# Patient Record
Sex: Female | Born: 1990 | Race: White | Hispanic: No | Marital: Single | Smoking: Never smoker
Health system: Southern US, Community
[De-identification: ages and names within clinical notes are randomized; demographics above are authoritative.]

## PROBLEM LIST (undated history)

## (undated) ENCOUNTER — Inpatient Hospital Stay (HOSPITAL_COMMUNITY): Payer: 59

## (undated) DIAGNOSIS — R519 Headache, unspecified: Secondary | ICD-10-CM

## (undated) DIAGNOSIS — S34109A Unspecified injury to unspecified level of lumbar spinal cord, initial encounter: Secondary | ICD-10-CM

## (undated) DIAGNOSIS — F32A Depression, unspecified: Secondary | ICD-10-CM

## (undated) DIAGNOSIS — N200 Calculus of kidney: Secondary | ICD-10-CM

## (undated) DIAGNOSIS — F419 Anxiety disorder, unspecified: Secondary | ICD-10-CM

## (undated) DIAGNOSIS — F329 Major depressive disorder, single episode, unspecified: Secondary | ICD-10-CM

## (undated) HISTORY — PX: WISDOM TOOTH EXTRACTION: SHX21

---
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
## 2018-05-15 DIAGNOSIS — Z113 Encounter for screening for infections with a predominantly sexual mode of transmission: Secondary | ICD-10-CM | POA: Diagnosis not present

## 2018-08-15 DIAGNOSIS — Z113 Encounter for screening for infections with a predominantly sexual mode of transmission: Secondary | ICD-10-CM | POA: Diagnosis not present

## 2018-08-15 DIAGNOSIS — Z114 Encounter for screening for human immunodeficiency virus [HIV]: Secondary | ICD-10-CM | POA: Diagnosis not present

## 2018-08-22 DIAGNOSIS — F526 Dyspareunia not due to a substance or known physiological condition: Secondary | ICD-10-CM | POA: Diagnosis not present

## 2018-08-22 DIAGNOSIS — R102 Pelvic and perineal pain: Secondary | ICD-10-CM | POA: Diagnosis not present

## 2018-08-22 DIAGNOSIS — Z30431 Encounter for routine checking of intrauterine contraceptive device: Secondary | ICD-10-CM | POA: Diagnosis not present

## 2018-09-04 DIAGNOSIS — N941 Unspecified dyspareunia: Secondary | ICD-10-CM | POA: Diagnosis not present

## 2018-09-04 DIAGNOSIS — R102 Pelvic and perineal pain: Secondary | ICD-10-CM | POA: Diagnosis not present

## 2018-10-18 DIAGNOSIS — Z113 Encounter for screening for infections with a predominantly sexual mode of transmission: Secondary | ICD-10-CM | POA: Diagnosis not present

## 2018-10-18 DIAGNOSIS — Z114 Encounter for screening for human immunodeficiency virus [HIV]: Secondary | ICD-10-CM | POA: Diagnosis not present

## 2018-10-18 DIAGNOSIS — K1370 Unspecified lesions of oral mucosa: Secondary | ICD-10-CM | POA: Diagnosis not present

## 2019-02-20 ENCOUNTER — Other Ambulatory Visit (HOSPITAL_COMMUNITY)
Admission: RE | Admit: 2019-02-20 | Discharge: 2019-02-20 | Disposition: A | Discharge status: Home / Self Care | Payer: BC Managed Care – PPO | Source: Ambulatory Visit | Attending: Obstetrics and Gynecology | Admitting: Obstetrics and Gynecology

## 2019-02-20 ENCOUNTER — Other Ambulatory Visit: Payer: Self-pay | Admitting: Obstetrics and Gynecology

## 2019-02-20 DIAGNOSIS — N898 Other specified noninflammatory disorders of vagina: Secondary | ICD-10-CM | POA: Diagnosis not present

## 2019-02-20 DIAGNOSIS — N9089 Other specified noninflammatory disorders of vulva and perineum: Secondary | ICD-10-CM | POA: Diagnosis not present

## 2019-02-20 DIAGNOSIS — Z01419 Encounter for gynecological examination (general) (routine) without abnormal findings: Secondary | ICD-10-CM | POA: Diagnosis not present

## 2019-02-22 LAB — CYTOLOGY - PAP
Chlamydia: NEGATIVE
Neisseria Gonorrhea: NEGATIVE

## 2019-03-12 ENCOUNTER — Other Ambulatory Visit: Payer: Self-pay

## 2019-03-12 ENCOUNTER — Emergency Department (HOSPITAL_COMMUNITY): Payer: BC Managed Care – PPO

## 2019-03-12 ENCOUNTER — Encounter (HOSPITAL_COMMUNITY): Payer: Self-pay

## 2019-03-12 ENCOUNTER — Emergency Department (HOSPITAL_COMMUNITY)
Admission: EM | Admit: 2019-03-12 | Discharge: 2019-03-12 | Disposition: A | Discharge status: Home / Self Care | Payer: BC Managed Care – PPO | Attending: Emergency Medicine | Admitting: Emergency Medicine

## 2019-03-12 DIAGNOSIS — N83201 Unspecified ovarian cyst, right side: Secondary | ICD-10-CM | POA: Insufficient documentation

## 2019-03-12 DIAGNOSIS — R102 Pelvic and perineal pain: Secondary | ICD-10-CM | POA: Diagnosis not present

## 2019-03-12 DIAGNOSIS — N83291 Other ovarian cyst, right side: Secondary | ICD-10-CM | POA: Diagnosis not present

## 2019-03-12 DIAGNOSIS — R109 Unspecified abdominal pain: Secondary | ICD-10-CM

## 2019-03-12 HISTORY — DX: Calculus of kidney: N20.0

## 2019-03-12 HISTORY — DX: Anxiety disorder, unspecified: F41.9

## 2019-03-12 HISTORY — DX: Unspecified injury to unspecified level of lumbar spinal cord, initial encounter: S34.109A

## 2019-03-12 HISTORY — DX: Depression, unspecified: F32.A

## 2019-03-12 LAB — COMPREHENSIVE METABOLIC PANEL
ALT: 20 U/L (ref 0–44)
AST: 21 U/L (ref 15–41)
Albumin: 4.1 g/dL (ref 3.5–5.0)
Alkaline Phosphatase: 57 U/L (ref 38–126)
Anion gap: 12 (ref 5–15)
BUN: 10 mg/dL (ref 6–20)
CO2: 24 mmol/L (ref 22–32)
Calcium: 9.5 mg/dL (ref 8.9–10.3)
Chloride: 106 mmol/L (ref 98–111)
Creatinine, Ser: 0.88 mg/dL (ref 0.44–1.00)
GFR calc Af Amer: >60 mL/min (ref >60)
GFR calc non Af Amer: >60 mL/min (ref >60)
Glucose, Bld: 106 mg/dL — ABNORMAL HIGH (ref 70–99)
Potassium: 4.6 mmol/L (ref 3.5–5.1)
Sodium: 142 mmol/L (ref 135–145)
Total Bilirubin: 0.6 mg/dL (ref 0.3–1.2)
Total Protein: 7 g/dL (ref 6.5–8.1)

## 2019-03-12 LAB — URINALYSIS, ROUTINE W REFLEX MICROSCOPIC
Bilirubin Urine: NEGATIVE
Glucose, UA: NEGATIVE mg/dL
Hgb urine dipstick: NEGATIVE
Ketones, ur: NEGATIVE mg/dL
Leukocytes,Ua: NEGATIVE
Nitrite: NEGATIVE
Protein, ur: NEGATIVE mg/dL
Specific Gravity, Urine: 1.018 (ref 1.005–1.030)
pH: 7 (ref 5.0–8.0)

## 2019-03-12 LAB — CBC
HCT: 43.7 % (ref 36.0–46.0)
Hemoglobin: 14.6 g/dL (ref 12.0–15.0)
MCH: 30.4 pg (ref 26.0–34.0)
MCHC: 33.4 g/dL (ref 30.0–36.0)
MCV: 91 fL (ref 80.0–100.0)
Platelets: 355 10*3/uL (ref 150–400)
RBC: 4.8 MIL/uL (ref 3.87–5.11)
RDW: 12 % (ref 11.5–15.5)
WBC: 11.6 10*3/uL — ABNORMAL HIGH (ref 4.0–10.5)
nRBC: 0 % (ref 0.0–0.2)

## 2019-03-12 LAB — I-STAT BETA HCG BLOOD, ED (MC, WL, AP ONLY): I-stat hCG, quantitative: <5 m[IU]/mL (ref <5)

## 2019-03-12 LAB — LIPASE, BLOOD: Lipase: 23 U/L (ref 11–51)

## 2019-03-12 MED ORDER — ACETAMINOPHEN 325 MG PO TABS
650.0000 mg | ORAL_TABLET | Freq: Once | ORAL | Status: AC
  Administered 2019-03-12: 09:00:00 650 mg via ORAL
  Filled 2019-03-12: qty 2

## 2019-03-12 MED ORDER — ONDANSETRON HCL 4 MG/2ML IJ SOLN
4.0000 mg | Freq: Once | INTRAMUSCULAR | Status: AC
  Administered 2019-03-12: 4 mg via INTRAVENOUS
  Filled 2019-03-12: qty 2

## 2019-03-12 MED ORDER — SODIUM CHLORIDE 0.9 % IV BOLUS
1000.0000 mL | Freq: Once | INTRAVENOUS | Status: AC
  Administered 2019-03-12: 1000 mL via INTRAVENOUS

## 2019-03-12 NOTE — Discharge Instructions (Signed)
You have a 3.5 cm right-sided ovarian cyst that may be causing your pain, we did not see any evidence of right-sided kidney stones and there was no evidence of an ovarian torsion on your ultrasound.  Continue using Tylenol, and warm compresses.  Follow-up with your OB/GYN next week as planned, return for new or worsening pain or any other new or concerning symptoms.

## 2019-03-12 NOTE — ED Notes (Signed)
Pt transported to CT ?

## 2019-03-12 NOTE — ED Triage Notes (Signed)
Pt endorses right sided flank pain x 5 hours, hx of kidney stone sand this feels the same. 1 episode of n/v. Tachy.

## 2019-03-12 NOTE — ED Provider Notes (Signed)
[facility]PART[facility] Provider Note   CSN: [id number] Arrival date & time: 03/12/19  5053     History   Chief Complaint Chief Complaint  Patient presents with   Nephrolithiasis    HPI UNIQUA KIHN is a 28 y.o. female.     TASHEIKA KITZMILLER is a 28 y.o. female with a history of kidney stones, lumbar spinal cord injury, anxiety and depression, who presents to the emergency department for evaluation of right-sided flank pain.  She reports flank pain started suddenly in the middle of the night about 5 hours prior to arrival.  She reports that she does have a history of kidney stones, although has not had any in a few years and reports pain feels similar.  She reports pain starts in her flank and radiates into her right lower abdomen near her ovary.  She reports that yesterday throughout the day she felt nauseated and had a few episodes of emesis, initially thought this was related to drinking the night prior, but symptoms persisted and then pain developed.  She does report a history of ovarian cysts, has an IUD in place, reports that her most recent ultrasound, she did not have a cyst present on the right side.  She has not had any vaginal bleeding or discharge.  She has not had any burning or pain with urination, and denies any blood in her urine.  Has never required surgery or lithotripsy for kidney stones in the past.  Has not taken anything for pain prior to arrival.  No fevers or chills.  No other aggravating or alleviating factors.     Past Medical History:  Diagnosis Date   Anxiety    Depression    Kidney stone    Lumbar spinal cord injury ([place])     There are no active problems to display for this patient.   History reviewed. No pertinent surgical history.   OB History   No obstetric history on file.      Home Medications    Prior to Admission medications   Not on File    Family History History reviewed. No pertinent family  history.  Social History Social History   Tobacco Use   Smoking status: Never Smoker  Substance Use Topics   Alcohol use: Yes    Comment: 2 times per week   Drug use: Never     Allergies   Augmentin [amoxicillin-pot clavulanate], Codeine, Ibuprofen, Lexapro [escitalopram oxalate], Lortab [hydrocodone-acetaminophen], and Zoloft [sertraline hcl]   Review of Systems Review of Systems  Constitutional: Negative for chills and fever.  Respiratory: Negative for cough and shortness of breath.   Cardiovascular: Negative for chest pain.  Gastrointestinal: Positive for abdominal pain, nausea and vomiting. Negative for blood in stool, constipation and diarrhea.  Genitourinary: Positive for flank pain and pelvic pain. Negative for dysuria, frequency, hematuria, vaginal bleeding and vaginal discharge.  Musculoskeletal: Negative for arthralgias and myalgias.  Skin: Negative for color change and rash.  Neurological: Negative for dizziness, syncope and light-headedness.     Physical Exam Updated Vital Signs BP (!) 140/107 (BP Location: Right Arm)    Pulse (!) 118    Temp 97.9 F (36.6 C) (Oral)    Resp 18    SpO2 99%   Physical Exam Vitals signs and nursing note reviewed.  Constitutional:      General: She is not in acute distress.    Appearance: Normal appearance. She is well-developed. She is obese. She is not  ill-appearing or diaphoretic.     Comments: Well-appearing and in no acute distress.  HENT:     Head: Normocephalic and atraumatic.     Mouth/Throat:     Mouth: Mucous membranes are moist.     Pharynx: Oropharynx is clear.  Eyes:     General:        Right eye: No discharge.        Left eye: No discharge.     Pupils: Pupils are equal, round, and reactive to light.  Neck:     Musculoskeletal: Neck supple.  Cardiovascular:     Rate and Rhythm: Normal rate and regular rhythm.     Heart sounds: Normal heart sounds. No murmur. No friction rub. No gallop.   Pulmonary:      Effort: Pulmonary effort is normal. No respiratory distress.     Breath sounds: Normal breath sounds. No wheezing or rales.     Comments: Respirations equal and unlabored, patient able to speak in full sentences, lungs clear to auscultation bilaterally Abdominal:     General: Bowel sounds are normal. There is no distension.     Palpations: Abdomen is soft. There is no mass.     Tenderness: There is abdominal tenderness. There is no guarding.     Comments: Abdomen soft, nondistended, mild tenderness in the right lower quadrant without guarding or peritoneal signs, no CVA tenderness bilaterally.  Musculoskeletal:        General: No deformity.     Right lower leg: No edema.     Left lower leg: No edema.  Skin:    General: Skin is warm and dry.     Capillary Refill: Capillary refill takes less than 2 seconds.  Neurological:     Mental Status: She is alert.     Coordination: Coordination normal.     Comments: Speech is clear, able to follow commands Moves extremities without ataxia, coordination intact  Psychiatric:        Mood and Affect: Mood normal.        Behavior: Behavior normal.      ED Treatments / Results  Labs (all labs ordered are listed, but only abnormal results are displayed) Labs Reviewed  COMPREHENSIVE METABOLIC PANEL - Abnormal; Notable for the following components:      Result Value   Glucose, Bld 106 (*)    All other components within normal limits  CBC - Abnormal; Notable for the following components:   WBC 11.6 (*)    All other components within normal limits  LIPASE, BLOOD  URINALYSIS, ROUTINE W REFLEX MICROSCOPIC  I-STAT BETA HCG BLOOD, ED ([facility], [facility], [facility] ONLY)    EKG None  Radiology [place] Transvaginal Non-ob  Result Date: 03/12/2019 CLINICAL DATA:  Acute onset right pelvic and groin pain for 1 day. Clinical concern for ovarian torsion. EXAM: TRANSABDOMINAL AND TRANSVAGINAL ULTRASOUND OF PELVIS DOPPLER ULTRASOUND OF OVARIES TECHNIQUE: Both transabdominal and  transvaginal ultrasound examinations of the pelvis were performed. Transabdominal technique was performed for global imaging of the pelvis including uterus, ovaries, adnexal regions, and pelvic cul-de-sac. It was necessary to proceed with endovaginal exam following the transabdominal exam to visualize the endometrium and ovaries. Color and duplex Doppler ultrasound was utilized to evaluate blood flow to the ovaries. COMPARISON:  None. FINDINGS: Uterus Measurements: 8.4 x 2.3 x 4.1 cm = volume: 51 mL. Retroflexed. No fibroids or other mass visualized. Endometrium Thickness: 3 mm.  IUD visualized within the endometrial cavity. Right ovary Measurements: 5.3 x 3.4 x  4.2 cm = volume: 39 mL. A complex cyst containing blood clot with retracted margins is seen which measures 3.5 x 2.6 x 2.4 cm. This has classic features for a hemorrhagic ovarian cyst. Left ovary Measurements: 3.0 x 2.8 x 1.7 cm = volume: 6 mL. Normal appearance/no adnexal mass. Pulsed Doppler evaluation of both ovaries demonstrates normal low-resistance arterial and venous waveforms. Other findings No abnormal free fluid. IMPRESSION: 3.5 cm benign-appearing hemorrhagic cyst of right ovary. IUD in appropriate position. Electronically Signed   By: Danae Orleans M.D.   On: 03/12/2019 11:19   US Pelvis Complete  Result Date: 03/12/2019 CLINICAL DATA:  Acute onset right pelvic and groin pain for 1 day. Clinical concern for ovarian torsion. EXAM: TRANSABDOMINAL AND TRANSVAGINAL ULTRASOUND OF PELVIS DOPPLER ULTRASOUND OF OVARIES TECHNIQUE: Both transabdominal and transvaginal ultrasound examinations of the pelvis were performed. Transabdominal technique was performed for global imaging of the pelvis including uterus, ovaries, adnexal regions, and pelvic cul-de-sac. It was necessary to proceed with endovaginal exam following the transabdominal exam to visualize the endometrium and ovaries. Color and duplex Doppler ultrasound was utilized to evaluate blood flow to  the ovaries. COMPARISON:  None. FINDINGS: Uterus Measurements: 8.4 x 2.3 x 4.1 cm = volume: 51 mL. Retroflexed. No fibroids or other mass visualized. Endometrium Thickness: 3 mm.  IUD visualized within the endometrial cavity. Right ovary Measurements: 5.3 x 3.4 x 4.2 cm = volume: 39 mL. A complex cyst containing blood clot with retracted margins is seen which measures 3.5 x 2.6 x 2.4 cm. This has classic features for a hemorrhagic ovarian cyst. Left ovary Measurements: 3.0 x 2.8 x 1.7 cm = volume: 6 mL. Normal appearance/no adnexal mass. Pulsed Doppler evaluation of both ovaries demonstrates normal low-resistance arterial and venous waveforms. Other findings No abnormal free fluid. IMPRESSION: 3.5 cm benign-appearing hemorrhagic cyst of right ovary. IUD in appropriate position. Electronically Signed   By: Danae Orleans M.D.   On: 03/12/2019 11:19   [place] Art/ven Flow Abd Pelv Doppler  Result Date: 03/12/2019 CLINICAL DATA:  Acute onset right pelvic and groin pain for 1 day. Clinical concern for ovarian torsion. EXAM: TRANSABDOMINAL AND TRANSVAGINAL ULTRASOUND OF PELVIS DOPPLER ULTRASOUND OF OVARIES TECHNIQUE: Both transabdominal and transvaginal ultrasound examinations of the pelvis were performed. Transabdominal technique was performed for global imaging of the pelvis including uterus, ovaries, adnexal regions, and pelvic cul-de-sac. It was necessary to proceed with endovaginal exam following the transabdominal exam to visualize the endometrium and ovaries. Color and duplex Doppler ultrasound was utilized to evaluate blood flow to the ovaries. COMPARISON:  None. FINDINGS: Uterus Measurements: 8.4 x 2.3 x 4.1 cm = volume: 51 mL. Retroflexed. No fibroids or other mass visualized. Endometrium Thickness: 3 mm.  IUD visualized within the endometrial cavity. Right ovary Measurements: 5.3 x 3.4 x 4.2 cm = volume: 39 mL. A complex cyst containing blood clot with retracted margins is seen which measures 3.5 x 2.6 x 2.4 cm.  This has classic features for a hemorrhagic ovarian cyst. Left ovary Measurements: 3.0 x 2.8 x 1.7 cm = volume: 6 mL. Normal appearance/no adnexal mass. Pulsed Doppler evaluation of both ovaries demonstrates normal low-resistance arterial and venous waveforms. Other findings No abnormal free fluid. IMPRESSION: 3.5 cm benign-appearing hemorrhagic cyst of right ovary. IUD in appropriate position. Electronically Signed   By: Danae Orleans M.D.   On: 03/12/2019 11:19   Ct Renal Stone Study  Result Date: 03/12/2019 CLINICAL DATA:  Right flank pain in a patient with a history  of urinary tract stones. EXAM: CT ABDOMEN AND PELVIS WITHOUT CONTRAST TECHNIQUE: Multidetector CT imaging of the abdomen and pelvis was performed following the standard protocol without IV contrast. COMPARISON:  CT abdomen and pelvis 03/27/2014. FINDINGS: Lower chest: Lung bases clear.  No pleural or pericardial effusion. Hepatobiliary: No focal liver abnormality is seen. No gallstones, gallbladder wall thickening, or biliary dilatation. Pancreas: Unremarkable. No pancreatic ductal dilatation or surrounding inflammatory changes. Spleen: Normal in size without focal abnormality. Adrenals/Urinary Tract: The adrenal glands appear normal. 0.6 cm nonobstructing stone lower pole left kidney is identified. No other urinary tract stones. No hydronephrosis. The right kidney appears normal. Urinary bladder and ureters are normal in appearance. Stomach/Bowel: Stomach is within normal limits. Appendix appears normal. No evidence of bowel wall thickening, distention, or inflammatory changes. Vascular/Lymphatic: No significant vascular findings are present. No enlarged abdominal or pelvic lymph nodes. Reproductive: Uterus and bilateral adnexa are unremarkable. 3.8 cm in diameter right ovarian cyst incidentally noted. IUD is in place. Other: Small fat containing umbilical hernia. Musculoskeletal: Negative. IMPRESSION: No acute abnormality abdomen or pelvis. 0.6  cm nonobstructing stone lower pole left kidney. Electronically Signed   By: Drusilla Kannerhomas  Dalessio M.D.   On: 03/12/2019 09:35    Procedures Procedures (including critical care time)  Medications Ordered in ED Medications  sodium chloride 0.9 % bolus 1,000 mL (1,000 mLs Intravenous New Bag/Given 03/12/19 0853)  ondansetron (ZOFRAN) injection 4 mg (4 mg Intravenous Given 03/12/19 0850)  acetaminophen (TYLENOL) tablet 650 mg (650 mg Oral Given 03/12/19 0851)     Initial Impression / Assessment and Plan / ED Course  I have reviewed the triage vital signs and the nursing notes.  Pertinent labs & imaging results that were available during my care of the patient were reviewed by me and considered in my medical decision making (see chart for details).  Patient presents with 5 hours of right flank pain radiating to the right lower abdomen, associated nausea and vomiting started yesterday.  No fevers.  Patient with history of both ovarian cysts and kidney stones, feels most similar to her kidney stone.  Will check abdominal labs and CT renal stone study.  Presentation seems most concerning for kidney stone, but would also consider appendicitis or ovarian cyst/torsion.  Offered narcotic pain medication but patient declined, given IV fluids, Zofran and Tylenol.  Labs reassuring, minimal leukocytosis of 11.6, normal hemoglobin, no acute electrolyte derangements, normal renal function and liver function and normal lipase.  Urinalysis without signs of infection, no hematuria or crystals noted.  Negative pregnancy.  CT renal stone study shows a 3.8 cm right ovarian cyst, IUD in place, there is a 0.6 cm nonobstructing stone within the lower left kidney but no right-sided renal or ureteral stones to explain patient's symptoms.  I suspect pain may be coming from ovarian cyst, will get ultrasound to rule out torsion.  Pelvic ultrasound shows a 3.5 cm benign-appearing cyst, likely hemorrhagic, no evidence of ovarian  torsion, no other acute findings within the pelvis.  Patient has upcoming follow-up appointment with her OB/GYN, will have her discussed cyst, continue with Tylenol and warm compresses as needed for pain.  Return precautions discussed.  Patient expresses understanding and agreement with plan.  Discharged home in good condition.  Final Clinical Impressions(s) / ED Diagnoses   Final diagnoses:  Right ovarian cyst  Right flank pain    ED Discharge Orders    None       Dartha LodgeFord, Matilda Fleig N, [place] JerseyPA-C 03/12/19 1201  Milagros Loll, MD 03/13/19 [phone]

## 2019-03-15 ENCOUNTER — Other Ambulatory Visit: Payer: Self-pay | Admitting: Obstetrics and Gynecology

## 2019-03-15 DIAGNOSIS — Z3202 Encounter for pregnancy test, result negative: Secondary | ICD-10-CM | POA: Diagnosis not present

## 2019-03-15 DIAGNOSIS — N83201 Unspecified ovarian cyst, right side: Secondary | ICD-10-CM | POA: Diagnosis not present

## 2019-03-15 DIAGNOSIS — N870 Mild cervical dysplasia: Secondary | ICD-10-CM | POA: Diagnosis not present

## 2019-04-16 DIAGNOSIS — F411 Generalized anxiety disorder: Secondary | ICD-10-CM | POA: Diagnosis not present

## 2019-04-16 DIAGNOSIS — R5383 Other fatigue: Secondary | ICD-10-CM | POA: Diagnosis not present

## 2019-04-16 DIAGNOSIS — E669 Obesity, unspecified: Secondary | ICD-10-CM | POA: Diagnosis not present

## 2019-04-16 DIAGNOSIS — F324 Major depressive disorder, single episode, in partial remission: Secondary | ICD-10-CM | POA: Diagnosis not present

## 2019-04-30 DIAGNOSIS — N83201 Unspecified ovarian cyst, right side: Secondary | ICD-10-CM | POA: Diagnosis not present

## 2019-05-21 DIAGNOSIS — F324 Major depressive disorder, single episode, in partial remission: Secondary | ICD-10-CM | POA: Diagnosis not present

## 2019-05-21 DIAGNOSIS — F411 Generalized anxiety disorder: Secondary | ICD-10-CM | POA: Diagnosis not present

## 2019-06-09 ENCOUNTER — Emergency Department (HOSPITAL_COMMUNITY)
Admission: EM | Admit: 2019-06-09 | Discharge: 2019-06-09 | Disposition: A | Discharge status: Home / Self Care | Payer: BC Managed Care – PPO | Attending: Emergency Medicine | Admitting: Emergency Medicine

## 2019-06-09 ENCOUNTER — Other Ambulatory Visit: Payer: Self-pay

## 2019-06-09 ENCOUNTER — Encounter (HOSPITAL_COMMUNITY): Payer: Self-pay

## 2019-06-09 DIAGNOSIS — S51851D Open bite of right forearm, subsequent encounter: Secondary | ICD-10-CM | POA: Diagnosis not present

## 2019-06-09 DIAGNOSIS — Z885 Allergy status to narcotic agent status: Secondary | ICD-10-CM | POA: Diagnosis not present

## 2019-06-09 DIAGNOSIS — S41151A Open bite of right upper arm, initial encounter: Secondary | ICD-10-CM | POA: Diagnosis not present

## 2019-06-09 DIAGNOSIS — W540XXA Bitten by dog, initial encounter: Secondary | ICD-10-CM | POA: Diagnosis not present

## 2019-06-09 DIAGNOSIS — R000 Tachycardia, unspecified: Secondary | ICD-10-CM | POA: Diagnosis not present

## 2019-06-09 DIAGNOSIS — S51851A Open bite of right forearm, initial encounter: Secondary | ICD-10-CM | POA: Diagnosis not present

## 2019-06-09 DIAGNOSIS — W540XXD Bitten by dog, subsequent encounter: Secondary | ICD-10-CM | POA: Diagnosis not present

## 2019-06-09 DIAGNOSIS — Z79899 Other long term (current) drug therapy: Secondary | ICD-10-CM | POA: Diagnosis not present

## 2019-06-09 DIAGNOSIS — Z043 Encounter for examination and observation following other accident: Secondary | ICD-10-CM | POA: Diagnosis not present

## 2019-06-09 DIAGNOSIS — T148XXA Other injury of unspecified body region, initial encounter: Secondary | ICD-10-CM

## 2019-06-09 DIAGNOSIS — Z886 Allergy status to analgesic agent status: Secondary | ICD-10-CM | POA: Diagnosis not present

## 2019-06-09 DIAGNOSIS — Z888 Allergy status to other drugs, medicaments and biological substances status: Secondary | ICD-10-CM | POA: Diagnosis not present

## 2019-06-09 DIAGNOSIS — F99 Mental disorder, not otherwise specified: Secondary | ICD-10-CM | POA: Diagnosis not present

## 2019-06-09 MED ORDER — OXYCODONE-ACETAMINOPHEN 5-325 MG PO TABS
1.0000 | ORAL_TABLET | Freq: Once | ORAL | Status: AC
  Administered 2019-06-09: 1 via ORAL
  Filled 2019-06-09: qty 1

## 2019-06-09 MED ORDER — BACITRACIN ZINC 500 UNIT/GM EX OINT
TOPICAL_OINTMENT | Freq: Once | CUTANEOUS | Status: AC
  Administered 2019-06-09: 22:00:00 via TOPICAL
  Filled 2019-06-09: qty 0.9

## 2019-06-09 MED ORDER — ONDANSETRON HCL 4 MG PO TABS
4.0000 mg | ORAL_TABLET | Freq: Once | ORAL | Status: AC
  Administered 2019-06-09: 4 mg via ORAL
  Filled 2019-06-09: qty 1

## 2019-06-09 NOTE — Discharge Instructions (Signed)
As we discussed, it is important that you have your wounds clean and dry.  You can gently wash with soap and water.  Make sure the wounds are patted dry.  Apply Neosporin or bacitracin 2 times a day.  Make sure that the wounds are dry before placing any bandages on them.  Take antibiotics as directed.  As we discussed, it is very important for you to take pain medication. You can take Tylenol or Ibuprofen as directed for pain. You can alternate Tylenol and Ibuprofen every 4 hours. If you take Tylenol at 1pm, then you can take Ibuprofen at 5pm. Then you can take Tylenol again at 9pm.    Take pain medications as directed for break through pain. Do not drive or operate machinery while taking this medication.   Do not exceed 4000 mg of Tylenol a day.  Keep the arm elevated to help with pain and swelling.  Provided you with outpatient orthopedic referral.  Please follow-up with them.  Call their office arrange for an appointment.  Return the emergency department for any worsening pain, numbness/weakness of the fingers, fevers, discoloration of the hand or any other worsening or concerning symptoms.

## 2019-06-09 NOTE — ED Provider Notes (Signed)
[facility] DEPT Provider Note   CSN: [id number] Arrival date & time: 06/09/19  1854     History   Chief Complaint Chief Complaint  Patient presents with   Animal Bite    HPI Rachel Farmer is a 28 y.o. female past history of anxiety, depression who presents for evaluation of dog bite to her right upper extremity.  Patient reports that this occurred approximate 11 AM this morning.  She went to [facility] initially and was evaluated.  While there, they did x-rays which were unremarkable.  She had her wounds cleaned out and had them closed with sutures.  Patient reports that she was discharged home with prescription for antibiotic and pain medication as well as referral appointment with orthopedics.  Patient reports that she has not taken any other pain medication or antibiotics.  She did receive a dose of IV antibiotics at [facility].  Patient comes in today because she would like a second opinion.  Patient states that her arm hurts and she is worried that he might of missed something at the previous hospital.  She has not taken any more pain medication since being here in the emergency department.  Reports some tingling sensation noted to her fingers.     The history is provided by the patient.    Past Medical History:  Diagnosis Date   Anxiety    Depression    Kidney stone    Lumbar spinal cord injury ([place])     There are no active problems to display for this patient.   History reviewed. No pertinent surgical history.   OB History   No obstetric history on file.      Home Medications    Prior to Admission medications   Not on File    Family History No family history on file.  Social History Social History   Tobacco Use   Smoking status: Never Smoker  Substance Use Topics   Alcohol use: Yes    Comment: 2 times per week   Drug use: Never     Allergies   Augmentin [amoxicillin-pot clavulanate], Codeine, Ibuprofen, Lexapro  [escitalopram oxalate], Lortab [hydrocodone-acetaminophen], and Zoloft [sertraline hcl]   Review of Systems Review of Systems  Musculoskeletal:       Arm pain  Skin: Positive for wound. Negative for color change.  Neurological: Negative for weakness.     Physical Exam Updated Vital Signs BP 135/80    Pulse 89    Temp 98 F (36.7 C)    Resp 18    Wt 110.6 kg    SpO2 98%   Physical Exam Vitals signs and nursing note reviewed.  Constitutional:      Appearance: She is well-developed.  HENT:     Head: Normocephalic and atraumatic.  Eyes:     General: No scleral icterus.       Right eye: No discharge.        Left eye: No discharge.     Conjunctiva/sclera: Conjunctivae normal.  Cardiovascular:     Pulses:          Radial pulses are 2+ on the right side and 2+ on the left side.  Pulmonary:     Effort: Pulmonary effort is normal.  Musculoskeletal:     Comments: Compartments of right upper extremity are soft without any overlying warmth, erythema, edema.  She has some mild soft tissue swelling noted surrounding the wounds.  Flexion and extension of elbow intact.  She has tenderness palpation  noted diffusely to the forearm.  No bony deformity or crepitus noted.  Flexion/ascension of the wrist is intact though limited secondary to pain.  She can wiggle all 5 digits.  She is able to flex and extend all 5 digits but does report some pain with doing so.  Skin:    General: Skin is warm and dry.     Capillary Refill: Capillary refill takes less than 2 seconds.     Comments: Bite wounds noted to right upper extremity with sutures in place.  No active drainage noted.  Please see photos below. Good distal cap refill. RUE is not dusky in appearance or cool to touch.   Neurological:     Mental Status: She is alert.  Psychiatric:        Speech: Speech normal.        Behavior: Behavior normal.              ED Treatments / Results  Labs (all labs ordered are listed, but only  abnormal results are displayed) Labs Reviewed - No data to display  EKG None  Radiology No results found.  Procedures Procedures (including critical care time)  Medications Ordered in ED Medications  oxyCODONE-acetaminophen (PERCOCET/ROXICET) 5-325 MG per tablet 1 tablet (1 tablet Oral Given 06/09/19 2055)  ondansetron (ZOFRAN) tablet 4 mg (4 mg Oral Given 06/09/19 2055)  bacitracin ointment ( Topical Given 06/09/19 2138)     Initial Impression / Assessment and Plan / ED Course  I have reviewed the triage vital signs and the nursing notes.  Pertinent labs & imaging results that were available during my care of the patient were reviewed by me and considered in my medical decision making (see chart for details).         28 year old female who presents for evaluation of animal bite to right upper extremity that occurred 11 AM this morning.  She has been seen by [facility] who did x-rays, give IV antibiotics.  They cleaned out wounds, applied Xeroform gauze and discharged home with the medication antibiotics.  She has not taken any of the pain medication since leaving.  Patient states that she is concerned that there was something that was missed and came for a second opinion.  Patient states that she did have a referral with outpatient orthopedics but she wonders if she should see them sooner.  She states she went home and was having a lot of pain in the arm as well as some tingling sensation to the distal aspect of her fingers.  Initially at arrival, she is tearful but nontoxic-appearing.  She is slightly tachycardic but vitals otherwise stable.  I suspect this tachycardia is most likely due to anxiety and pain.  On evaluation, she has good cap refill, good radial pulses.  Compartments are soft and not concerning for compartment syndrome.  She has scattered wounds that are closed with loose sutures.  No active drainage.  No overlying warmth, erythema.  She can move her arm but does report some  pain when doing so.  She can flex and extend all 5 digits but does report some pain with doing so. Patient had mentioned concern for tendon injury. Patient [facility] but states that she herself is concerned there may be tendon disruption. She does have flexion/extension in all five fingers.No obvious tendon injury. At this time, I feel that leaving the wounds untouched and not removing sutures would be in patients best interest.  I reviewed her paperwork from [facility].  Unfortunately and unable to access her x-rays but on her discharge paperwork, I do not see any evidence of broken bone.  I suspect a lot of this is pain control and she has not taken any pain medication since leaving the hospital.  We will give her dose of pain medication here.  At this time, her exam is not concerning for compartment syndrome, ischemic limb.  We will plan to give her pain medication here in the emergency department.  At this time, I do not feel that this warrants repeat imaging.  I discussed with the patient the importance of pain control as well as antibiotics and follow-up with orthopedics.  She was referred to an orthopedic emergency exam but would like one in [place].  We will give her on our on-call orthopedics.  Plan to rePete wound dressings, bacitracin. Discussed patient with Dr. Rhunette Croft who is agreeable to plan.   Portions of this note were generated with Scientist, clinical (histocompatibility and immunogenetics). Dictation errors may occur despite best attempts at proofreading.   Final Clinical Impressions(s) / ED Diagnoses   Final diagnoses:  Animal bite    ED Discharge Orders    None       Rosana Hoes 06/09/19 2214    Derwood Kaplan, MD 06/15/19 2014

## 2019-06-09 NOTE — ED Notes (Signed)
PT able to speak in complete sentences. Pt report losing feeling in their right hand. Seeking second opinion and additional sutures

## 2019-06-09 NOTE — ED Triage Notes (Signed)
Pt states that she was bit by her boyfriend's dog today on her right forearm. Pt states that they flushed out the wound and sutured the wound loosely at [facility]. Pt states that the bite went through tendon. Pt received morphine and lido, and states that something feels off. Pt wanted to see if she could have a 2nd opinion on her arm.

## 2019-06-09 NOTE — ED Notes (Signed)
Lyndsey PA at bedside

## 2019-06-09 NOTE — ED Notes (Signed)
Pt verbalizes understanding of DC instructions. Pt belongings returned and is ambulatory out of ED.  

## 2019-06-13 DIAGNOSIS — W540XXA Bitten by dog, initial encounter: Secondary | ICD-10-CM | POA: Diagnosis not present

## 2019-06-13 DIAGNOSIS — M79631 Pain in right forearm: Secondary | ICD-10-CM | POA: Diagnosis not present

## 2019-06-20 DIAGNOSIS — M79631 Pain in right forearm: Secondary | ICD-10-CM | POA: Diagnosis not present

## 2019-06-20 DIAGNOSIS — W540XXA Bitten by dog, initial encounter: Secondary | ICD-10-CM | POA: Diagnosis not present

## 2019-06-22 DIAGNOSIS — M25631 Stiffness of right wrist, not elsewhere classified: Secondary | ICD-10-CM | POA: Diagnosis not present

## 2019-07-05 DIAGNOSIS — M25631 Stiffness of right wrist, not elsewhere classified: Secondary | ICD-10-CM | POA: Diagnosis not present

## 2019-07-12 DIAGNOSIS — M25631 Stiffness of right wrist, not elsewhere classified: Secondary | ICD-10-CM | POA: Diagnosis not present

## 2019-07-18 DIAGNOSIS — W540XXA Bitten by dog, initial encounter: Secondary | ICD-10-CM | POA: Diagnosis not present

## 2019-07-18 DIAGNOSIS — M79631 Pain in right forearm: Secondary | ICD-10-CM | POA: Diagnosis not present

## 2019-10-29 DIAGNOSIS — Z113 Encounter for screening for infections with a predominantly sexual mode of transmission: Secondary | ICD-10-CM | POA: Diagnosis not present

## 2019-10-29 DIAGNOSIS — N898 Other specified noninflammatory disorders of vagina: Secondary | ICD-10-CM | POA: Diagnosis not present

## 2019-10-29 DIAGNOSIS — N611 Abscess of the breast and nipple: Secondary | ICD-10-CM | POA: Diagnosis not present

## 2020-02-13 ENCOUNTER — Emergency Department (HOSPITAL_COMMUNITY)
Admission: EM | Admit: 2020-02-13 | Discharge: 2020-02-14 | Disposition: A | Discharge status: Home / Self Care | Payer: BC Managed Care – PPO | Attending: Emergency Medicine | Admitting: Emergency Medicine

## 2020-02-13 ENCOUNTER — Emergency Department (HOSPITAL_COMMUNITY): Payer: BC Managed Care – PPO

## 2020-02-13 ENCOUNTER — Encounter (HOSPITAL_COMMUNITY): Payer: Self-pay | Admitting: *Deleted

## 2020-02-13 DIAGNOSIS — K8020 Calculus of gallbladder without cholecystitis without obstruction: Secondary | ICD-10-CM | POA: Diagnosis not present

## 2020-02-13 DIAGNOSIS — R109 Unspecified abdominal pain: Secondary | ICD-10-CM | POA: Diagnosis not present

## 2020-02-13 DIAGNOSIS — N201 Calculus of ureter: Secondary | ICD-10-CM | POA: Diagnosis not present

## 2020-02-13 DIAGNOSIS — N854 Malposition of uterus: Secondary | ICD-10-CM | POA: Diagnosis not present

## 2020-02-13 DIAGNOSIS — N132 Hydronephrosis with renal and ureteral calculous obstruction: Secondary | ICD-10-CM | POA: Diagnosis not present

## 2020-02-13 LAB — URINALYSIS, ROUTINE W REFLEX MICROSCOPIC
Bilirubin Urine: NEGATIVE
Glucose, UA: NEGATIVE mg/dL
Ketones, ur: NEGATIVE mg/dL
Leukocytes,Ua: NEGATIVE
Nitrite: NEGATIVE
Protein, ur: NEGATIVE mg/dL
Specific Gravity, Urine: 1.017 (ref 1.005–1.030)
pH: 5 (ref 5.0–8.0)

## 2020-02-13 LAB — I-STAT CHEM 8, ED
BUN: 16 mg/dL (ref 6–20)
Calcium, Ion: 1.27 mmol/L (ref 1.15–1.40)
Chloride: 102 mmol/L (ref 98–111)
Creatinine, Ser: 0.9 mg/dL (ref 0.44–1.00)
Glucose, Bld: 92 mg/dL (ref 70–99)
HCT: 43 % (ref 36.0–46.0)
Hemoglobin: 14.6 g/dL (ref 12.0–15.0)
Potassium: 4.7 mmol/L (ref 3.5–5.1)
Sodium: 139 mmol/L (ref 135–145)
TCO2: 27 mmol/L (ref 22–32)

## 2020-02-13 LAB — I-STAT BETA HCG BLOOD, ED (MC, WL, AP ONLY): I-stat hCG, quantitative: <5 m[IU]/mL (ref <5)

## 2020-02-13 MED ORDER — ONDANSETRON HCL 4 MG PO TABS: 4.0000 mg | ORAL_TABLET | Freq: Once | ORAL | Status: DC

## 2020-02-13 MED ORDER — ONDANSETRON 4 MG PO TBDP
8.0000 mg | ORAL_TABLET | Freq: Once | ORAL | Status: AC
  Administered 2020-02-13: 8 mg via ORAL
  Filled 2020-02-13: qty 2

## 2020-02-13 MED ORDER — KETOROLAC TROMETHAMINE 30 MG/ML IJ SOLN
30.0000 mg | Freq: Once | INTRAMUSCULAR | Status: AC
  Administered 2020-02-13: 30 mg via INTRAMUSCULAR
  Filled 2020-02-13: qty 1

## 2020-02-13 MED ORDER — OXYCODONE-ACETAMINOPHEN 5-325 MG PO TABS: 1.0000 | ORAL_TABLET | ORAL | Status: DC | PRN

## 2020-02-13 NOTE — ED Triage Notes (Signed)
To ED for eval of left flank pain since yesterday afternoon. States she has hx of same. Last time she felt this she had ovarian cyst. Kidney stones since she was 15. Severe pain when she urinated 1.5 hrs ago. Alert and oriented. PA and EDP in to eval pt in triage.

## 2020-02-13 NOTE — ED Provider Notes (Addendum)
Patient placed in Quick Look pathway, seen and evaluated   Chief Complaint: Stabbing pain in lower left back HPI:   Left flank pain; travelling into her abdomen and pelvic region. Kidney stones since she was 15. History of a ovarian cyst as well. Severe pain with urination. Started yesterday afternoon. Sharp pain ebs and flows, constant intense pain. Feels hot and sweaty. Pt has IUD.  Not vaccinated. No COVID symptoms. No exposures. No urologist here in [place]  ROS: Nausea, pain with urination, no frequency, no vomiting/diarrhea.   Physical Exam:   Gen: No distress  Neuro: Awake and Alert  Skin: Warm    Focused Exam: Left flank tenderness, soft and nontender abdomen    Toradol, Zofran, Renal stone study initiated. Verbal orders for ISTAT chem 8, UA, preg.   Initiation of care has begun. The patient has been counseled on the process, plan, and necessity for staying for the completion/evaluation, and the remainder of the medical screening examination\      Mare Ferrari, PA-C 02/13/20 1352    Sabas Sous, MD 02/13/20 1521

## 2020-02-13 NOTE — ED Notes (Signed)
No answer for VS x1 

## 2020-02-14 IMAGING — US US ART/VEN ABD/PELV/SCROTUM DOPPLER LTD
1 series · 13 of 25 positions shown · non-contrast
Comparison: None.

CLINICAL DATA: Acute onset right pelvic and groin pain for 1 day.
Clinical concern for ovarian torsion.

EXAM:
TRANSABDOMINAL AND TRANSVAGINAL ULTRASOUND OF PELVIS
DOPPLER ULTRASOUND OF OVARIES
TECHNIQUE: Both transabdominal and transvaginal ultrasound examinations of the
pelvis were performed. Transabdominal technique was performed for
global imaging of the pelvis including uterus, ovaries, adnexal
regions, and pelvic cul-de-sac.
It was necessary to proceed with endovaginal exam following the
transabdominal exam to visualize the endometrium and ovaries. Color
and duplex Doppler ultrasound was utilized to evaluate blood flow to
the ovaries.

[Series 1: us art/ven abd/pelv/scrotum doppler ltd · 89 acquisitions, 13 frames shown]
[im 1/89]
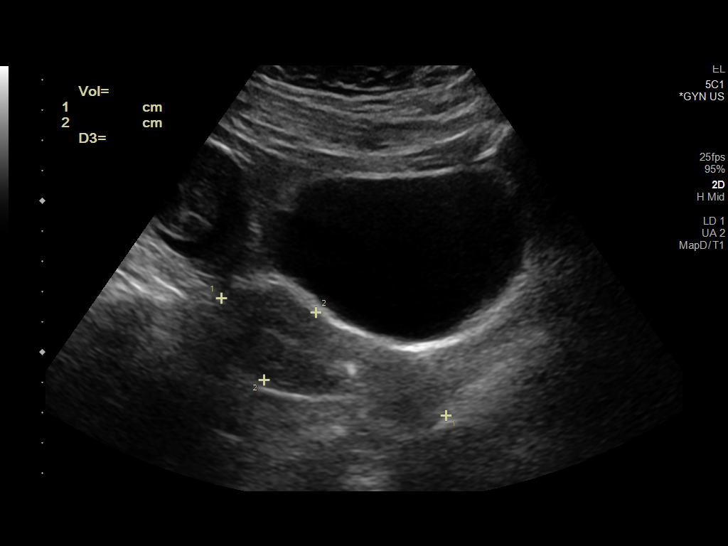
[im 8/89]
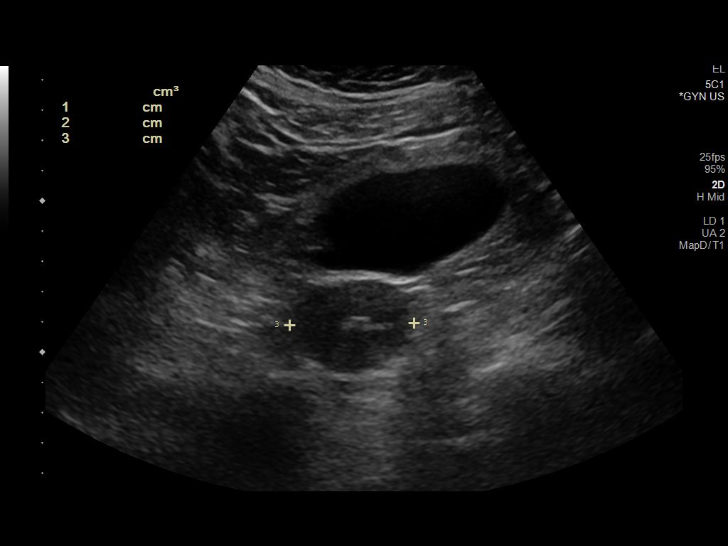
[im 15/89]
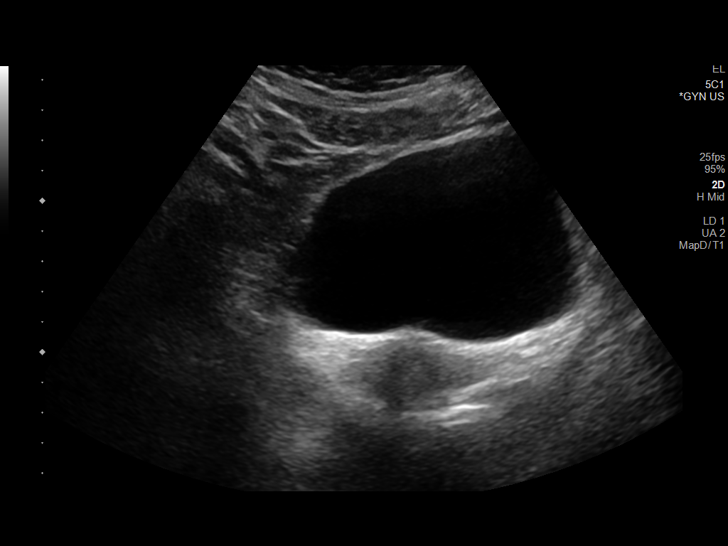
[im 23/89]
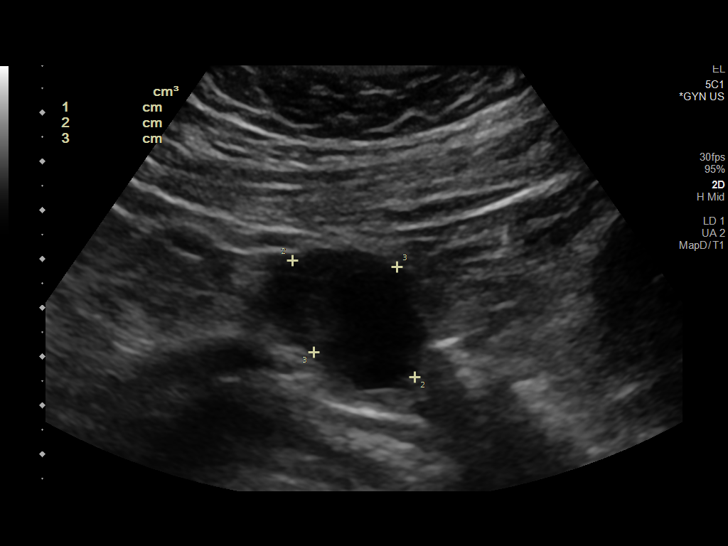
[im 30/89]
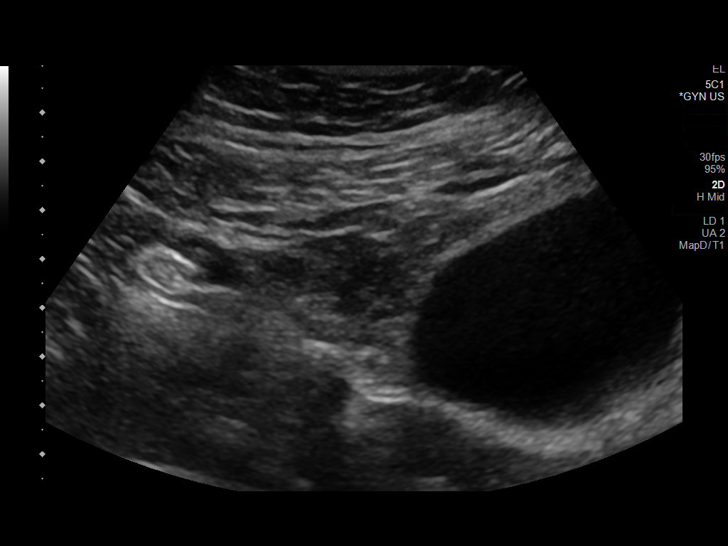
[im 37/89]
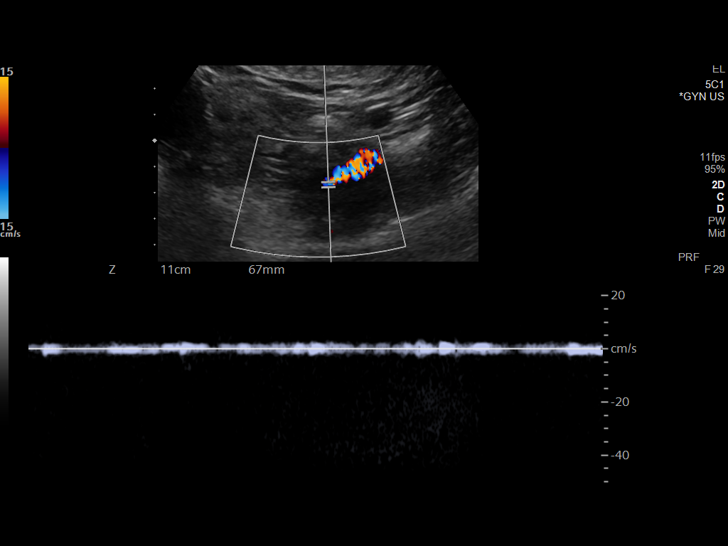
[im 45/89]
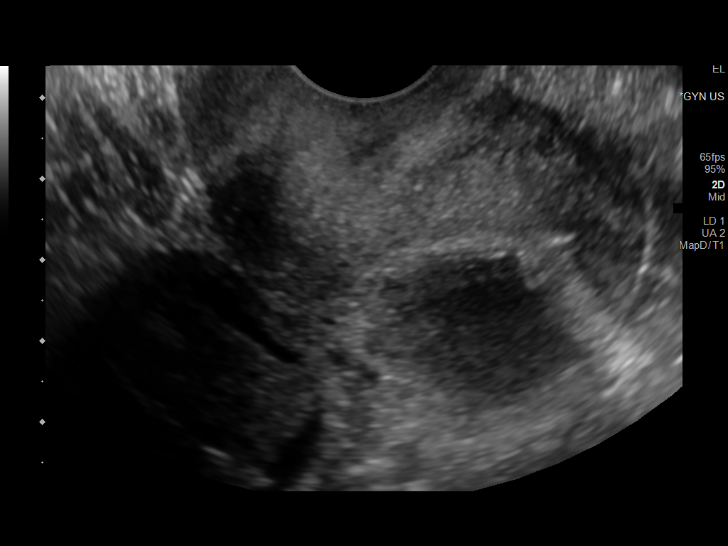
[im 52/89]
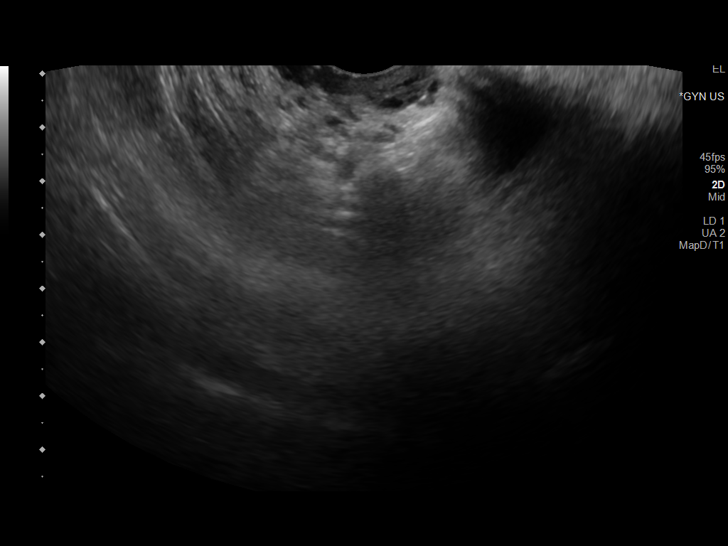
[im 59/89]
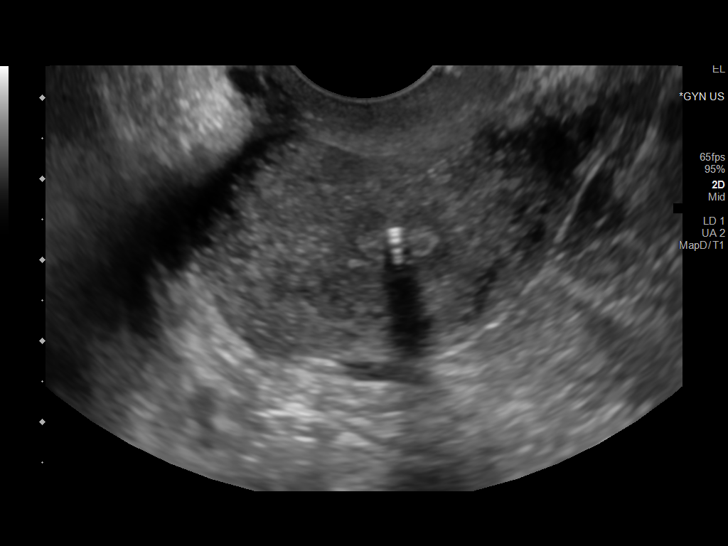
[im 67/89]
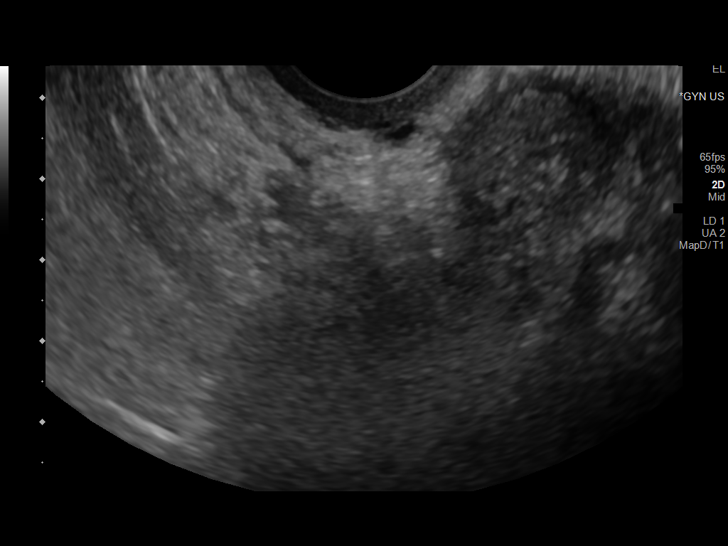
[im 74/89]
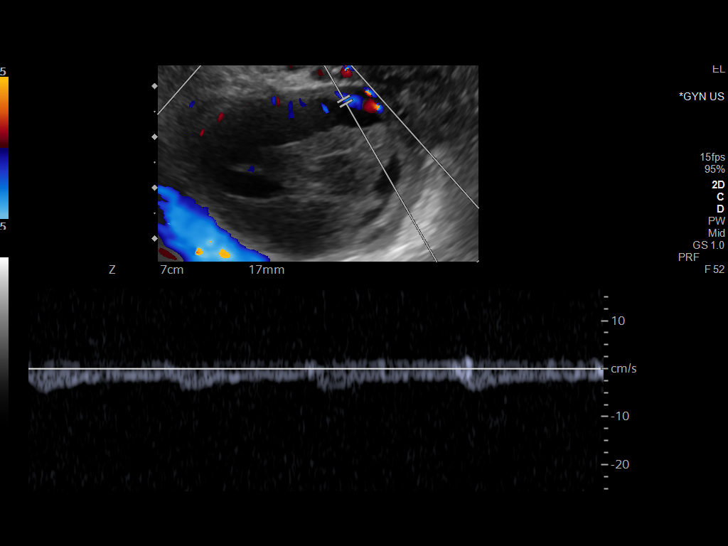
[im 81/89]
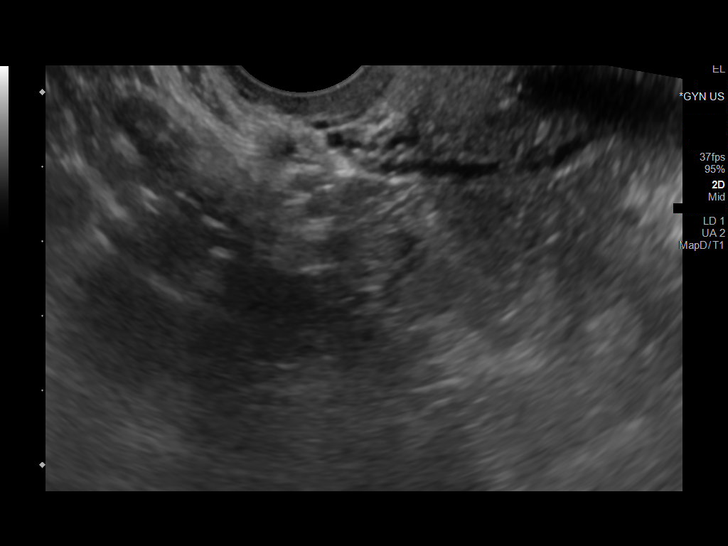
[im 89/89]
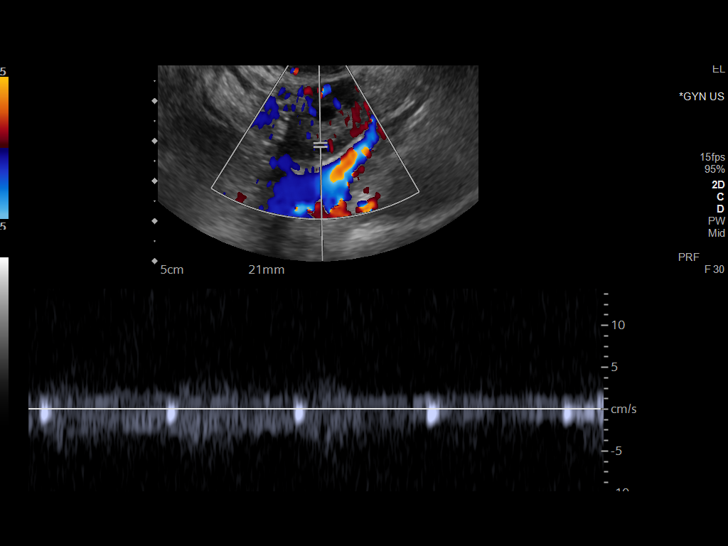

[13 of 25 positions shown; findings below may reference images not displayed]

FINDINGS: Uterus

Measurements: 8.4 x 2.3 x 4.1 cm = volume: 51 mL. Retroflexed. No
fibroids or other mass visualized.

Endometrium

Thickness: 3 mm.  IUD visualized within the endometrial cavity.

Right ovary

Measurements: 5.3 x 3.4 x 4.2 cm = volume: 39 mL. A complex cyst
containing blood clot with retracted margins is seen which measures
3.5 x 2.6 x 2.4 cm. This has classic features for a hemorrhagic
ovarian cyst.

Left ovary

Measurements: 3.0 x 2.8 x 1.7 cm = volume: 6 mL. Normal
appearance/no adnexal mass.

Pulsed Doppler evaluation of both ovaries demonstrates normal
low-resistance arterial and venous waveforms.

Other findings

No abnormal free fluid.
IMPRESSION: 3.5 cm benign-appearing hemorrhagic cyst of right ovary.

IUD in appropriate position.

## 2020-02-14 MED ORDER — OXYCODONE-ACETAMINOPHEN 5-325 MG PO TABS: 1.0000 | ORAL_TABLET | ORAL | 0 refills | Status: DC | PRN

## 2020-02-14 MED ORDER — TAMSULOSIN HCL 0.4 MG PO CAPS: 0.4000 mg | ORAL_CAPSULE | Freq: Every day | ORAL | 0 refills | Status: DC

## 2020-02-14 MED ORDER — ONDANSETRON 4 MG PO TBDP: 4.0000 mg | ORAL_TABLET | Freq: Three times a day (TID) | ORAL | 0 refills | Status: DC | PRN

## 2020-02-14 NOTE — Discharge Instructions (Addendum)
Follow up with [facility] for further management of your 6 mm kidney stone that is in the upper left urinary tube.   Take medications as prescribed for pain, nausea and to help urination. Return to the ED if you develop any fever, have severe pain or uncontrolled vomiting.

## 2020-02-14 NOTE — ED Provider Notes (Signed)
[facility] EMERGENCY DEPARTMENT Provider Note   CSN: [id number] Arrival date & time: 02/13/20  1247     History Chief Complaint  Patient presents with  . Flank Pain    Rachel Farmer is a 29 y.o. female.  Patient with history of kidney stones presents with left flank pain that started last night and radiates to LLQ abdomen and groin, c/w previous experience with kidney stones. She has had associated nausea with vomiting. No fever. No hematuria. She does report pain with urination.   The history is provided by the patient. No language interpreter was used.       Past Medical History:  Diagnosis Date  . Anxiety   . Depression   . Kidney stone   . Lumbar spinal cord injury (HCC)     There are no problems to display for this patient.   History reviewed. No pertinent surgical history.   OB History   No obstetric history on file.     No family history on file.  Social History   Tobacco Use  . Smoking status: Never Smoker  Substance Use Topics  . Alcohol use: Yes    Comment: 2 times per week  . Drug use: Never    Home Medications Prior to Admission medications   Not on File    Allergies    Augmentin [amoxicillin-pot clavulanate], Codeine, Ibuprofen, Lexapro [escitalopram oxalate], Lortab [hydrocodone-acetaminophen], and Zoloft [sertraline hcl]  Review of Systems   Review of Systems  Constitutional: Negative for chills and fever.  Gastrointestinal: Positive for abdominal pain and nausea.  Genitourinary: Positive for dysuria and flank pain. Negative for hematuria.  Skin: Negative.   Neurological: Negative.     Physical Exam Updated Vital Signs BP 128/89 (BP Location: Right Arm)   Pulse 81   Temp 98.3 F (36.8 C) (Oral)   Resp 18   SpO2 98%   Physical Exam Vitals and nursing note reviewed.  Constitutional:      Appearance: She is well-developed.  HENT:     Head: Normocephalic.  Cardiovascular:     Rate and Rhythm: Normal  rate.  Pulmonary:     Effort: Pulmonary effort is normal.  Abdominal:     General: Bowel sounds are normal.     Palpations: Abdomen is soft.     Tenderness: There is no abdominal tenderness. There is left CVA tenderness. There is no guarding or rebound.  Musculoskeletal:        General: Normal range of motion.     Cervical back: Normal range of motion and neck supple.  Skin:    General: Skin is warm and dry.     Findings: No rash.  Neurological:     Mental Status: She is alert.     ED Results / Procedures / Treatments   Labs (all labs ordered are listed, but only abnormal results are displayed) Labs Reviewed  URINALYSIS, ROUTINE W REFLEX MICROSCOPIC - Abnormal; Notable for the following components:      Result Value   Hgb urine dipstick MODERATE (*)    Bacteria, UA RARE (*)    All other components within normal limits  I-STAT BETA HCG BLOOD, ED ([facility], [facility], [facility] ONLY)  I-STAT CHEM 8, ED  I-STAT BETA HCG BLOOD, ED ([facility], [facility], [facility] ONLY)   Results for orders placed or performed during the hospital encounter of 02/13/20  Urinalysis, Routine w reflex microscopic  Result Value Ref Range   Color, Urine YELLOW YELLOW   APPearance  CLEAR CLEAR   Specific Gravity, Urine 1.017 1.005 - 1.030   pH 5.0 5.0 - 8.0   Glucose, UA NEGATIVE NEGATIVE mg/dL   Hgb urine dipstick MODERATE (A) NEGATIVE   Bilirubin Urine NEGATIVE NEGATIVE   Ketones, ur NEGATIVE NEGATIVE mg/dL   Protein, ur NEGATIVE NEGATIVE mg/dL   Nitrite NEGATIVE NEGATIVE   Leukocytes,Ua NEGATIVE NEGATIVE   RBC / HPF 21-50 0 - 5 RBC/hpf   WBC, UA 0-5 0 - 5 WBC/hpf   Bacteria, UA RARE (A) NONE SEEN   Squamous Epithelial / LPF 0-5 0 - 5   Mucus PRESENT   I-Stat beta hCG blood, ED  Result Value Ref Range   I-stat hCG, quantitative <5.0 <5 mIU/mL   Comment 3          I-stat chem 8, ED (not at [facility] or [facility])  Result Value Ref Range   Sodium 139 135 - 145 mmol/L   Potassium 4.7 3.5 - 5.1 mmol/L   Chloride 102 98 - 111 mmol/L   BUN 16  6 - 20 mg/dL   Creatinine, Ser 5.00 0.44 - 1.00 mg/dL   Glucose, Bld 92 70 - 99 mg/dL   Calcium, Ion 9.38 1.82 - 1.40 mmol/L   TCO2 27 22 - 32 mmol/L   Hemoglobin 14.6 12.0 - 15.0 g/dL   HCT 99.3 36 - 46 %   EKG None  Radiology CT Renal Stone Study  Result Date: 02/13/2020 CLINICAL DATA:  29 year old female with left flank pain. Concern for kidney stone. EXAM: CT ABDOMEN AND PELVIS WITHOUT CONTRAST TECHNIQUE: Multidetector CT imaging of the abdomen and pelvis was performed following the standard protocol without IV contrast. COMPARISON:  CT abdomen pelvis dated 03/12/2019. FINDINGS: Evaluation of this exam is limited in the absence of intravenous contrast. Lower chest: The visualized lung bases are clear. No intra-abdominal free air or free fluid. Hepatobiliary: The liver is unremarkable. No intrahepatic biliary dilatation. Multiple noncalcified stone noted within the gallbladder. No pericholecystic fluid or evidence of acute cholecystitis. Pancreas: Unremarkable. No pancreatic ductal dilatation or surrounding inflammatory changes. Spleen: Normal in size without focal abnormality. Adrenals/Urinary Tract: The adrenal glands unremarkable. There is a 6 mm stone in the left ureteropelvic junction with mild left hydronephrosis. This likely corresponds to the stone seen in the inferior pole of the kidney on the prior CT. The right kidney is unremarkable. The right ureter and urinary bladder appear unremarkable. Stomach/Bowel: There is no bowel obstruction or active inflammation. The appendix is normal. Vascular/Lymphatic: The abdominal aorta and IVC unremarkable. No portal venous gas. There is no adenopathy. Reproductive: The uterus is retroflexed. An intrauterine device is noted. No adnexal masses. Other: None Musculoskeletal: No acute or significant osseous findings. IMPRESSION: 1. A 6 mm left UPJ stone with mild left hydronephrosis. 2. Cholelithiasis. Electronically Signed   By: Elgie Collard M.D.    On: 02/13/2020 16:47    Procedures Procedures (including critical care time)  Medications Ordered in ED Medications  ketorolac (TORADOL) 30 MG/ML injection 30 mg (30 mg Intramuscular Given 02/13/20 1600)  ondansetron (ZOFRAN-ODT) disintegrating tablet 8 mg (8 mg Oral Given 02/13/20 1601)    ED Course  I have reviewed the triage vital signs and the nursing notes.  Pertinent labs & imaging results that were available during my care of the patient were reviewed by me and considered in my medical decision making (see chart for details).    MDM Rules/Calculators/A&P  Patient to ED with left flank pain and h/o kidney stones. No fever.   No evidence of infection, no fever. CT scan shows 6 mm stone left UPJ. No renal dysfunction. Pain controlled with Toradol, nausea resolved with Zofran.   She can be discharged home with urology follow up. Strict return precautions discussed.   Final Clinical Impression(s) / ED Diagnoses Final diagnoses:  None   1. Left ureterolithiasis  Rx / DC Orders ED Discharge Orders    None       Elpidio Anis, PA-C 02/14/20 4235    Shon Baton, MD 02/14/20 [phone]

## 2020-02-15 ENCOUNTER — Other Ambulatory Visit (HOSPITAL_COMMUNITY)
Admission: RE | Admit: 2020-02-15 | Discharge: 2020-02-15 | Disposition: A | Discharge status: Home / Self Care | Payer: BC Managed Care – PPO | Source: Ambulatory Visit | Attending: Urology | Admitting: Urology

## 2020-02-15 ENCOUNTER — Other Ambulatory Visit: Payer: Self-pay | Admitting: Urology

## 2020-02-15 DIAGNOSIS — N201 Calculus of ureter: Secondary | ICD-10-CM | POA: Diagnosis not present

## 2020-02-15 DIAGNOSIS — Z01812 Encounter for preprocedural laboratory examination: Secondary | ICD-10-CM | POA: Diagnosis not present

## 2020-02-15 DIAGNOSIS — Z20822 Contact with and (suspected) exposure to covid-19: Secondary | ICD-10-CM | POA: Diagnosis not present

## 2020-02-15 LAB — SARS CORONAVIRUS 2 (TAT 6-24 HRS): SARS Coronavirus 2: NEGATIVE

## 2020-02-15 NOTE — Progress Notes (Signed)
Patient to arrive at 1030 on 02/18/2020. History and medications reviewed. All pre-procedure instructions given. NPO after MN. Driver secured.

## 2020-02-17 NOTE — H&P (Signed)
02/15/20-patient is a 29 year old white female presented to the [facility] emergency room on 02/13/2020 with acute onset left-sided flank pain with some nausea and vomiting. No fever or gross hematuria. Has had prior history of stones. During evaluation CT urogram confirmed a 6 mm left upper ureter/UPJ stone with mild left hydronephrosis. I reviewed the CT scan and concur. She was discharged home on medical expulsive therapy is now here for follow-up. She has continued to have discomfort. Current pain level is.  Micro urinalysis today is clear on urine spun sediment. KUB is reviewed today and shows a persistent 6 mm calcification in the region of L2 on the left essentially unchanged from position on prior CT scan.     ALLERGIES: Codeine Lexapro Lortab zoloft    MEDICATIONS: Tamsulosin Hcl 0.4 mg capsule  Cymbalta 60 mg capsule,delayed release  Oxycodone Hcl 5 mg tablet  Zofran     GU PSH: No GU PSH    NON-GU PSH: No Non-GU PSH    GU PMH: Renal calculus    NON-GU PMH: Anxiety Depression    FAMILY HISTORY: No Family History    SOCIAL HISTORY: Marital Status: Single Preferred Language: English; Ethnicity: Not Hispanic Or Latino; Race: White Current Smoking Status: Patient has never smoked.   Tobacco Use Assessment Completed: Used Tobacco in last 30 days? Social Drinker.  Drinks 3 caffeinated drinks per day.    REVIEW OF SYSTEMS:    GU Review Female:   Patient reports frequent urination and get up at night to urinate. Patient denies hard to postpone urination, burning /pain with urination, leakage of urine, stream starts and stops, trouble starting your stream, have to strain to urinate, and being pregnant.  Gastrointestinal (Upper):   Patient denies nausea, vomiting, and indigestion/ heartburn.  Gastrointestinal (Lower):   Patient reports constipation. Patient denies diarrhea.  Constitutional:   Patient reports night sweats. Patient denies fever, weight loss, and fatigue.  Skin:    Patient denies skin rash/ lesion and itching.  Eyes:   Patient denies blurred vision and double vision.  Ears/ Nose/ Throat:   Patient denies sinus problems and sore throat.  Hematologic/Lymphatic:   Patient denies swollen glands and easy bruising.  Cardiovascular:   Patient denies leg swelling and chest pains.  Respiratory:   Patient denies cough and shortness of breath.  Endocrine:   Patient denies excessive thirst.  Musculoskeletal:   Patient reports back pain and joint pain.   Neurological:   Patient reports headaches. Patient denies dizziness.  Psychologic:   Patient reports depression and anxiety.    VITAL SIGNS:      02/15/2020 08:23 AM  Weight 245 lb / 111.13 kg  Height 66 in / 167.64 cm  BP 109/74 mmHg  Pulse 83 /min  Temperature 98.2 F / 36.7 C  BMI 39.5 kg/m   MULTI-SYSTEM PHYSICAL EXAMINATION:    Constitutional: Obese white female in no acute distress  Neck: Neck symmetrical, not swollen. Normal tracheal position.  Respiratory: No labored breathing, no use of accessory muscles.   Cardiovascular: Normal temperature, normal extremity pulses, no swelling, no varicosities.  Lymphatic: No enlargement of neck, axillae, groin.  Skin: No paleness, no jaundice, no cyanosis. No lesion, no ulcer, no rash.  Neurologic / Psychiatric: Oriented to time, oriented to place, oriented to person. No depression, no anxiety, no agitation.  Gastrointestinal: Obese abdomen. She has mild left-sided CVA tenderness  Eyes: Normal conjunctivae. Normal eyelids.  Ears, Nose, Mouth, and Throat: Left ear no scars, no lesions, no masses.  Right ear no scars, no lesions, no masses. Nose no scars, no lesions, no masses. Normal hearing. Normal lips.  Musculoskeletal: Normal gait and station of head and neck.     Complexity of Data:  Records Review:   Previous Hospital Records, Previous Patient Records  X-Ray Review: C.T. Abdomen/Pelvis: Reviewed Films. Reviewed Report. Discussed With Patient.      PROCEDURES:         KUB - [id number]  A single view of the abdomen is obtained.      Patient confirmed No Neulasta OnPro Device.           Urinalysis - 81003 Dipstick Dipstick Cont'd  Color: Yellow Bilirubin: Neg mg/dL  Appearance: Clear Ketones: Neg mg/dL  Specific Gravity: 6.803 Blood: Neg ery/uL  pH: 5.5 Protein: Neg mg/dL  Glucose: Neg mg/dL Urobilinogen: 0.2 mg/dL    Nitrites: Neg    Leukocyte Esterase: Neg leu/uL    ASSESSMENT:      ICD-10 Details  1 GU:   Ureteral calculus - N20.1 Undiagnosed New Problem   PLAN:           Orders X-Rays: KUB          Schedule Return Visit/Planned Activity: ASAP             Note: Left ureteral ESL as soon as possible next week          Document Letter(s):  Created for Patient: Clinical Summary         Notes:   We discussed treatment options for left upper ureteral calculus. She is minimally symptomatic today no significant nausea vomiting and no fever. We discussed possibility of ureteroscopic laser lithotripsy versus in situ ESL. She has opted for ESL. Will schedule accordingly in the near future. Risks and benefits discussed as outlined below. She knows if she develops worsening pain nausea vomiting or fever in the interim she we may need to proceed with cysto stent or ureteroscopy.  I have discussed with the patient the risks and consequences of the procedure of extracorporeal shockwave lithotripsy to include, but not limited to: Bleeding, including bleeding in the urine, bleeding around the kidney with hematoma formation and rarely bleeding to the point of loss of the kidney, infection, damage to the surrounding structures including soft tissue and bowel perforation, residual stone fragments requiring the need for future treatments including endoscopic surgery, open surgery or percutaneous surgery. I have emphasized that study showed that up to 25% of patients will require additional procedures depending on stone size and composition.  I have also discussed with the patient that the success rate for ESWL is approximately 60-90% and depends on stone location and stone composition as well as preoperative stone size. The patient voices understanding of the risks and benefits of the above and consents to the procedure.

## 2020-02-18 ENCOUNTER — Ambulatory Visit (HOSPITAL_BASED_OUTPATIENT_CLINIC_OR_DEPARTMENT_OTHER)
Admission: RE | Admit: 2020-02-18 | Discharge: 2020-02-18 | Disposition: A | Discharge status: Home / Self Care | Payer: BC Managed Care – PPO | Attending: Urology | Admitting: Urology

## 2020-02-18 ENCOUNTER — Ambulatory Visit (HOSPITAL_COMMUNITY): Payer: BC Managed Care – PPO

## 2020-02-18 ENCOUNTER — Other Ambulatory Visit: Payer: Self-pay

## 2020-02-18 ENCOUNTER — Encounter (HOSPITAL_BASED_OUTPATIENT_CLINIC_OR_DEPARTMENT_OTHER)
Admission: RE | Disposition: A | Discharge status: Home / Self Care | Payer: Self-pay | Source: Home / Self Care | Attending: Urology

## 2020-02-18 ENCOUNTER — Encounter (HOSPITAL_BASED_OUTPATIENT_CLINIC_OR_DEPARTMENT_OTHER): Payer: Self-pay | Admitting: Urology

## 2020-02-18 DIAGNOSIS — F329 Major depressive disorder, single episode, unspecified: Secondary | ICD-10-CM | POA: Insufficient documentation

## 2020-02-18 DIAGNOSIS — N132 Hydronephrosis with renal and ureteral calculous obstruction: Secondary | ICD-10-CM | POA: Diagnosis not present

## 2020-02-18 DIAGNOSIS — Z79891 Long term (current) use of opiate analgesic: Secondary | ICD-10-CM | POA: Diagnosis not present

## 2020-02-18 DIAGNOSIS — N201 Calculus of ureter: Secondary | ICD-10-CM | POA: Diagnosis not present

## 2020-02-18 DIAGNOSIS — Z888 Allergy status to other drugs, medicaments and biological substances status: Secondary | ICD-10-CM | POA: Insufficient documentation

## 2020-02-18 DIAGNOSIS — F419 Anxiety disorder, unspecified: Secondary | ICD-10-CM | POA: Insufficient documentation

## 2020-02-18 DIAGNOSIS — Z79899 Other long term (current) drug therapy: Secondary | ICD-10-CM | POA: Diagnosis not present

## 2020-02-18 DIAGNOSIS — Z885 Allergy status to narcotic agent status: Secondary | ICD-10-CM | POA: Insufficient documentation

## 2020-02-18 DIAGNOSIS — Z87442 Personal history of urinary calculi: Secondary | ICD-10-CM | POA: Diagnosis not present

## 2020-02-18 DIAGNOSIS — Z01818 Encounter for other preprocedural examination: Secondary | ICD-10-CM | POA: Diagnosis not present

## 2020-02-18 HISTORY — PX: EXTRACORPOREAL SHOCK WAVE LITHOTRIPSY: SHX1557

## 2020-02-18 LAB — POCT PREGNANCY, URINE: Preg Test, Ur: NEGATIVE

## 2020-02-18 SURGERY — LITHOTRIPSY, ESWL
Anesthesia: LOCAL | Laterality: Left

## 2020-02-18 MED ORDER — OXYCODONE HCL 5 MG PO TABS
ORAL_TABLET | ORAL | Status: AC
  Filled 2020-02-18: qty 1

## 2020-02-18 MED ORDER — DIPHENHYDRAMINE HCL 25 MG PO CAPS
ORAL_CAPSULE | ORAL | Status: AC
  Filled 2020-02-18: qty 1

## 2020-02-18 MED ORDER — SODIUM CHLORIDE 0.9 % IV SOLN: INTRAVENOUS | Status: DC

## 2020-02-18 MED ORDER — ACETAMINOPHEN 325 MG RE SUPP: 650.0000 mg | RECTAL | Status: DC | PRN

## 2020-02-18 MED ORDER — OXYCODONE HCL 5 MG PO TABS
5.0000 mg | ORAL_TABLET | ORAL | Status: DC | PRN
  Administered 2020-02-18: 5 mg via ORAL

## 2020-02-18 MED ORDER — DIAZEPAM 5 MG PO TABS
ORAL_TABLET | ORAL | Status: AC
  Filled 2020-02-18: qty 2

## 2020-02-18 MED ORDER — SODIUM CHLORIDE 0.9% FLUSH: 3.0000 mL | INTRAVENOUS | Status: DC | PRN

## 2020-02-18 MED ORDER — CIPROFLOXACIN HCL 500 MG PO TABS
500.0000 mg | ORAL_TABLET | ORAL | Status: AC
  Administered 2020-02-18: 500 mg via ORAL

## 2020-02-18 MED ORDER — ACETAMINOPHEN 325 MG PO TABS: 650.0000 mg | ORAL_TABLET | ORAL | Status: DC | PRN

## 2020-02-18 MED ORDER — SODIUM CHLORIDE 0.9% FLUSH: 3.0000 mL | Freq: Two times a day (BID) | INTRAVENOUS | Status: DC

## 2020-02-18 MED ORDER — CIPROFLOXACIN HCL 500 MG PO TABS
ORAL_TABLET | ORAL | Status: AC
  Filled 2020-02-18: qty 1

## 2020-02-18 MED ORDER — SODIUM CHLORIDE 0.9 % IV SOLN: 250.0000 mL | INTRAVENOUS | Status: DC | PRN

## 2020-02-18 MED ORDER — DIAZEPAM 5 MG PO TABS
10.0000 mg | ORAL_TABLET | ORAL | Status: AC
  Administered 2020-02-18: 10 mg via ORAL

## 2020-02-18 MED ORDER — DIPHENHYDRAMINE HCL 25 MG PO CAPS
25.0000 mg | ORAL_CAPSULE | ORAL | Status: AC
  Administered 2020-02-18: 25 mg via ORAL

## 2020-02-18 NOTE — Discharge Instructions (Signed)
Lithotripsy, Care After This sheet gives you information about how to care for yourself after your procedure. Your health care provider may also give you more specific instructions. If you have problems or questions, contact your health care provider. What can I expect after the procedure? After the procedure, it is common to have:  Some blood in your urine. This should only last for a few days.  Soreness in your back, sides, or upper abdomen for a few days.  Blotches or bruises on your back where the pressure wave entered the skin.  Pain, discomfort, or nausea when pieces (fragments) of the kidney stone move through the tube that carries urine from the kidney to the bladder (ureter). Stone fragments may pass soon after the procedure, but they may continue to pass for up to 4-8 weeks. ? If you have severe pain or nausea, contact your health care provider. This may be caused by a large stone that was not broken up, and this may mean that you need more treatment.  Some pain or discomfort during urination.  Some pain or discomfort in the lower abdomen or (in men) at the base of the penis. Follow these instructions at home: Medicines  Take over-the-counter and prescription medicines only as told by your health care provider.  If you were prescribed an antibiotic medicine, take it as told by your health care provider. Do not stop taking the antibiotic even if you start to feel better.  Do not drive for 24 hours if you were given a medicine to help you relax (sedative).  Do not drive or use heavy machinery while taking prescription pain medicine. Eating and drinking      Drink enough water and fluids to keep your urine clear or pale yellow. This helps any remaining pieces of the stone to pass. It can also help prevent new stones from forming.  Eat plenty of fresh fruits and vegetables.  Follow instructions from your health care provider about eating and drinking restrictions. You may be  instructed: ? To reduce how much salt (sodium) you eat or drink. Check ingredients and nutrition facts on packaged foods and beverages. ? To reduce how much meat you eat.  Eat the recommended amount of calcium for your age and gender. Ask your health care provider how much calcium you should have. General instructions  Get plenty of rest.  Most people can resume normal activities 1-2 days after the procedure. Ask your health care provider what activities are safe for you.  Your health care provider may direct you to lie in a certain position (postural drainage) and tap firmly (percuss) over your kidney area to help stone fragments pass. Follow instructions as told by your health care provider.  If directed, strain all urine through the strainer that was provided by your health care provider. ? Keep all fragments for your health care provider to see. Any stones that are found may be sent to a medical lab for examination. The stone may be as small as a grain of salt.  Keep all follow-up visits as told by your health care provider. This is important. Contact a health care provider if:  You have pain that is severe or does not get better with medicine.  You have nausea that is severe or does not go away.  You have blood in your urine longer than your health care provider told you to expect.  You have more blood in your urine.  You have pain during urination that does   not go away.  You urinate more frequently than usual and this does not go away.  You develop a rash or any other possible signs of an allergic reaction. Get help right away if:  You have severe pain in your back, sides, or upper abdomen.  You have severe pain while urinating.  Your urine is very dark red.  You have blood in your stool (feces).  You cannot pass any urine at all.  You feel a strong urge to urinate after emptying your bladder.  You have a fever or chills.  You develop shortness of breath,  difficulty breathing, or chest pain.  You have severe nausea that leads to persistent vomiting.  You faint. Summary  After this procedure, it is common to have some pain, discomfort, or nausea when pieces (fragments) of the kidney stone move through the tube that carries urine from the kidney to the bladder (ureter). If this pain or nausea is severe, however, you should contact your health care provider.  Most people can resume normal activities 1-2 days after the procedure. Ask your health care provider what activities are safe for you.  Drink enough water and fluids to keep your urine clear or pale yellow. This helps any remaining pieces of the stone to pass, and it can help prevent new stones from forming.  If directed, strain your urine and keep all fragments for your health care provider to see. Fragments or stones may be as small as a grain of salt.  Get help right away if you have severe pain in your back, sides, or upper abdomen or have severe pain while urinating. This information is not intended to replace advice given to you by your health care provider. Make sure you discuss any questions you have with your health care provider. Document Revised: 10/02/2018 Document Reviewed: 05/12/2016 Elsevier Patient Education  2020 [employer]   Post Anesthesia Home Care Instructions  Activity: Get plenty of rest for the remainder of the day. A responsible individual must stay with you for 24 hours following the procedure.  For the next 24 hours, DO NOT: -Drive a car -Operate machinery -Drink alcoholic beverages -Take any medication unless instructed by your physician -Make any legal decisions or sign important papers.  Meals: Start with liquid foods such as gelatin or soup. Progress to regular foods as tolerated. Avoid greasy, spicy, heavy foods. If nausea and/or vomiting occur, drink only clear liquids until the nausea and/or vomiting subsides. Call your physician if vomiting  continues.  Special Instructions/Symptoms: Your throat may feel dry or sore from the anesthesia or the breathing tube placed in your throat during surgery. If this causes discomfort, gargle with warm salt water. The discomfort should disappear within 24 hours.  

## 2020-02-18 NOTE — Interval H&P Note (Signed)
History and Physical Interval Note:  No change in stone position.   02/18/2020 12:44 PM  Rachel Farmer  has presented today for surgery, with the diagnosis of LEFT URETERAL CALCULUS.  The various methods of treatment have been discussed with the patient and family. After consideration of risks, benefits and other options for treatment, the patient has consented to  Procedure(s): EXTRACORPOREAL SHOCK WAVE LITHOTRIPSY (ESWL) (Left) as a surgical intervention.  The patient's history has been reviewed, patient examined, no change in status, stable for surgery.  I have reviewed the patient's chart and labs.  Questions were answered to the patient's satisfaction.     Bjorn Pippin

## 2020-02-19 ENCOUNTER — Encounter (HOSPITAL_BASED_OUTPATIENT_CLINIC_OR_DEPARTMENT_OTHER): Payer: Self-pay | Admitting: Urology

## 2020-02-26 DIAGNOSIS — N201 Calculus of ureter: Secondary | ICD-10-CM | POA: Diagnosis not present

## 2020-03-14 DIAGNOSIS — F339 Major depressive disorder, recurrent, unspecified: Secondary | ICD-10-CM | POA: Diagnosis not present

## 2020-03-18 DIAGNOSIS — N201 Calculus of ureter: Secondary | ICD-10-CM | POA: Diagnosis not present

## 2020-03-28 DIAGNOSIS — F411 Generalized anxiety disorder: Secondary | ICD-10-CM | POA: Diagnosis not present

## 2020-03-28 DIAGNOSIS — F339 Major depressive disorder, recurrent, unspecified: Secondary | ICD-10-CM | POA: Diagnosis not present

## 2020-04-02 DIAGNOSIS — Z01419 Encounter for gynecological examination (general) (routine) without abnormal findings: Secondary | ICD-10-CM | POA: Diagnosis not present

## 2020-05-08 DIAGNOSIS — R87610 Atypical squamous cells of undetermined significance on cytologic smear of cervix (ASC-US): Secondary | ICD-10-CM | POA: Diagnosis not present

## 2020-05-08 DIAGNOSIS — Z3202 Encounter for pregnancy test, result negative: Secondary | ICD-10-CM | POA: Diagnosis not present

## 2020-06-03 DIAGNOSIS — F4312 Post-traumatic stress disorder, chronic: Secondary | ICD-10-CM | POA: Diagnosis not present

## 2020-06-03 DIAGNOSIS — Z79891 Long term (current) use of opiate analgesic: Secondary | ICD-10-CM | POA: Diagnosis not present

## 2020-06-03 DIAGNOSIS — F411 Generalized anxiety disorder: Secondary | ICD-10-CM | POA: Diagnosis not present

## 2020-06-03 DIAGNOSIS — F331 Major depressive disorder, recurrent, moderate: Secondary | ICD-10-CM | POA: Diagnosis not present

## 2020-06-12 DIAGNOSIS — F4311 Post-traumatic stress disorder, acute: Secondary | ICD-10-CM | POA: Diagnosis not present

## 2020-06-12 DIAGNOSIS — F331 Major depressive disorder, recurrent, moderate: Secondary | ICD-10-CM | POA: Diagnosis not present

## 2020-06-12 DIAGNOSIS — F411 Generalized anxiety disorder: Secondary | ICD-10-CM | POA: Diagnosis not present

## 2020-06-12 DIAGNOSIS — F4312 Post-traumatic stress disorder, chronic: Secondary | ICD-10-CM | POA: Diagnosis not present

## 2020-07-03 DIAGNOSIS — F331 Major depressive disorder, recurrent, moderate: Secondary | ICD-10-CM | POA: Diagnosis not present

## 2020-07-03 DIAGNOSIS — F411 Generalized anxiety disorder: Secondary | ICD-10-CM | POA: Diagnosis not present

## 2020-07-09 DIAGNOSIS — B349 Viral infection, unspecified: Secondary | ICD-10-CM | POA: Diagnosis not present

## 2020-07-10 DIAGNOSIS — U071 COVID-19: Secondary | ICD-10-CM | POA: Diagnosis not present

## 2020-07-10 DIAGNOSIS — B349 Viral infection, unspecified: Secondary | ICD-10-CM | POA: Diagnosis not present

## 2020-08-05 DIAGNOSIS — R635 Abnormal weight gain: Secondary | ICD-10-CM | POA: Diagnosis not present

## 2020-08-05 DIAGNOSIS — E538 Deficiency of other specified B group vitamins: Secondary | ICD-10-CM | POA: Diagnosis not present

## 2020-08-05 DIAGNOSIS — E559 Vitamin D deficiency, unspecified: Secondary | ICD-10-CM | POA: Diagnosis not present

## 2020-08-21 DIAGNOSIS — E538 Deficiency of other specified B group vitamins: Secondary | ICD-10-CM | POA: Diagnosis not present

## 2020-08-28 DIAGNOSIS — E538 Deficiency of other specified B group vitamins: Secondary | ICD-10-CM | POA: Diagnosis not present

## 2020-09-05 DIAGNOSIS — E538 Deficiency of other specified B group vitamins: Secondary | ICD-10-CM | POA: Diagnosis not present

## 2020-09-11 DIAGNOSIS — E538 Deficiency of other specified B group vitamins: Secondary | ICD-10-CM | POA: Diagnosis not present

## 2020-09-11 DIAGNOSIS — T148XXA Other injury of unspecified body region, initial encounter: Secondary | ICD-10-CM | POA: Diagnosis not present

## 2020-09-24 DIAGNOSIS — E538 Deficiency of other specified B group vitamins: Secondary | ICD-10-CM | POA: Diagnosis not present

## 2020-09-24 DIAGNOSIS — D72820 Lymphocytosis (symptomatic): Secondary | ICD-10-CM | POA: Diagnosis not present

## 2020-09-24 DIAGNOSIS — J351 Hypertrophy of tonsils: Secondary | ICD-10-CM | POA: Diagnosis not present

## 2020-09-24 DIAGNOSIS — E559 Vitamin D deficiency, unspecified: Secondary | ICD-10-CM | POA: Diagnosis not present

## 2020-09-24 DIAGNOSIS — G4730 Sleep apnea, unspecified: Secondary | ICD-10-CM | POA: Diagnosis not present

## 2020-09-25 DIAGNOSIS — F411 Generalized anxiety disorder: Secondary | ICD-10-CM | POA: Diagnosis not present

## 2020-09-25 DIAGNOSIS — F331 Major depressive disorder, recurrent, moderate: Secondary | ICD-10-CM | POA: Diagnosis not present

## 2020-10-16 DIAGNOSIS — M9904 Segmental and somatic dysfunction of sacral region: Secondary | ICD-10-CM | POA: Diagnosis not present

## 2020-10-16 DIAGNOSIS — M5137 Other intervertebral disc degeneration, lumbosacral region: Secondary | ICD-10-CM | POA: Diagnosis not present

## 2020-10-16 DIAGNOSIS — M4727 Other spondylosis with radiculopathy, lumbosacral region: Secondary | ICD-10-CM | POA: Diagnosis not present

## 2020-10-16 DIAGNOSIS — M9903 Segmental and somatic dysfunction of lumbar region: Secondary | ICD-10-CM | POA: Diagnosis not present

## 2020-10-20 DIAGNOSIS — M9904 Segmental and somatic dysfunction of sacral region: Secondary | ICD-10-CM | POA: Diagnosis not present

## 2020-10-20 DIAGNOSIS — M4727 Other spondylosis with radiculopathy, lumbosacral region: Secondary | ICD-10-CM | POA: Diagnosis not present

## 2020-10-20 DIAGNOSIS — M5137 Other intervertebral disc degeneration, lumbosacral region: Secondary | ICD-10-CM | POA: Diagnosis not present

## 2020-10-20 DIAGNOSIS — M9903 Segmental and somatic dysfunction of lumbar region: Secondary | ICD-10-CM | POA: Diagnosis not present

## 2020-10-22 DIAGNOSIS — M5137 Other intervertebral disc degeneration, lumbosacral region: Secondary | ICD-10-CM | POA: Diagnosis not present

## 2020-10-22 DIAGNOSIS — M9904 Segmental and somatic dysfunction of sacral region: Secondary | ICD-10-CM | POA: Diagnosis not present

## 2020-10-22 DIAGNOSIS — M9903 Segmental and somatic dysfunction of lumbar region: Secondary | ICD-10-CM | POA: Diagnosis not present

## 2020-10-22 DIAGNOSIS — M4727 Other spondylosis with radiculopathy, lumbosacral region: Secondary | ICD-10-CM | POA: Diagnosis not present

## 2020-10-24 DIAGNOSIS — M5137 Other intervertebral disc degeneration, lumbosacral region: Secondary | ICD-10-CM | POA: Diagnosis not present

## 2020-10-24 DIAGNOSIS — M9903 Segmental and somatic dysfunction of lumbar region: Secondary | ICD-10-CM | POA: Diagnosis not present

## 2020-10-24 DIAGNOSIS — M4727 Other spondylosis with radiculopathy, lumbosacral region: Secondary | ICD-10-CM | POA: Diagnosis not present

## 2020-10-24 DIAGNOSIS — M9904 Segmental and somatic dysfunction of sacral region: Secondary | ICD-10-CM | POA: Diagnosis not present

## 2020-10-27 DIAGNOSIS — M5137 Other intervertebral disc degeneration, lumbosacral region: Secondary | ICD-10-CM | POA: Diagnosis not present

## 2020-10-27 DIAGNOSIS — M9903 Segmental and somatic dysfunction of lumbar region: Secondary | ICD-10-CM | POA: Diagnosis not present

## 2020-10-27 DIAGNOSIS — M9904 Segmental and somatic dysfunction of sacral region: Secondary | ICD-10-CM | POA: Diagnosis not present

## 2020-10-27 DIAGNOSIS — M4727 Other spondylosis with radiculopathy, lumbosacral region: Secondary | ICD-10-CM | POA: Diagnosis not present

## 2020-10-30 DIAGNOSIS — M4727 Other spondylosis with radiculopathy, lumbosacral region: Secondary | ICD-10-CM | POA: Diagnosis not present

## 2020-10-30 DIAGNOSIS — M5137 Other intervertebral disc degeneration, lumbosacral region: Secondary | ICD-10-CM | POA: Diagnosis not present

## 2020-10-30 DIAGNOSIS — M9904 Segmental and somatic dysfunction of sacral region: Secondary | ICD-10-CM | POA: Diagnosis not present

## 2020-10-30 DIAGNOSIS — M9903 Segmental and somatic dysfunction of lumbar region: Secondary | ICD-10-CM | POA: Diagnosis not present

## 2020-10-31 DIAGNOSIS — M9903 Segmental and somatic dysfunction of lumbar region: Secondary | ICD-10-CM | POA: Diagnosis not present

## 2020-10-31 DIAGNOSIS — M5137 Other intervertebral disc degeneration, lumbosacral region: Secondary | ICD-10-CM | POA: Diagnosis not present

## 2020-10-31 DIAGNOSIS — M9904 Segmental and somatic dysfunction of sacral region: Secondary | ICD-10-CM | POA: Diagnosis not present

## 2020-10-31 DIAGNOSIS — M4727 Other spondylosis with radiculopathy, lumbosacral region: Secondary | ICD-10-CM | POA: Diagnosis not present

## 2020-11-06 DIAGNOSIS — M9904 Segmental and somatic dysfunction of sacral region: Secondary | ICD-10-CM | POA: Diagnosis not present

## 2020-11-06 DIAGNOSIS — M9903 Segmental and somatic dysfunction of lumbar region: Secondary | ICD-10-CM | POA: Diagnosis not present

## 2020-11-06 DIAGNOSIS — M4727 Other spondylosis with radiculopathy, lumbosacral region: Secondary | ICD-10-CM | POA: Diagnosis not present

## 2020-11-06 DIAGNOSIS — M5137 Other intervertebral disc degeneration, lumbosacral region: Secondary | ICD-10-CM | POA: Diagnosis not present

## 2020-11-10 DIAGNOSIS — E559 Vitamin D deficiency, unspecified: Secondary | ICD-10-CM | POA: Diagnosis not present

## 2020-11-10 DIAGNOSIS — E538 Deficiency of other specified B group vitamins: Secondary | ICD-10-CM | POA: Diagnosis not present

## 2020-11-11 DIAGNOSIS — M5137 Other intervertebral disc degeneration, lumbosacral region: Secondary | ICD-10-CM | POA: Diagnosis not present

## 2020-11-11 DIAGNOSIS — M9904 Segmental and somatic dysfunction of sacral region: Secondary | ICD-10-CM | POA: Diagnosis not present

## 2020-11-11 DIAGNOSIS — M9903 Segmental and somatic dysfunction of lumbar region: Secondary | ICD-10-CM | POA: Diagnosis not present

## 2020-11-11 DIAGNOSIS — M4727 Other spondylosis with radiculopathy, lumbosacral region: Secondary | ICD-10-CM | POA: Diagnosis not present

## 2020-11-17 DIAGNOSIS — M4727 Other spondylosis with radiculopathy, lumbosacral region: Secondary | ICD-10-CM | POA: Diagnosis not present

## 2020-11-17 DIAGNOSIS — M9903 Segmental and somatic dysfunction of lumbar region: Secondary | ICD-10-CM | POA: Diagnosis not present

## 2020-11-17 DIAGNOSIS — M9904 Segmental and somatic dysfunction of sacral region: Secondary | ICD-10-CM | POA: Diagnosis not present

## 2020-11-17 DIAGNOSIS — M5137 Other intervertebral disc degeneration, lumbosacral region: Secondary | ICD-10-CM | POA: Diagnosis not present

## 2020-11-17 DIAGNOSIS — L720 Epidermal cyst: Secondary | ICD-10-CM | POA: Diagnosis not present

## 2020-11-24 DIAGNOSIS — M9903 Segmental and somatic dysfunction of lumbar region: Secondary | ICD-10-CM | POA: Diagnosis not present

## 2020-11-24 DIAGNOSIS — M4727 Other spondylosis with radiculopathy, lumbosacral region: Secondary | ICD-10-CM | POA: Diagnosis not present

## 2020-11-24 DIAGNOSIS — M9904 Segmental and somatic dysfunction of sacral region: Secondary | ICD-10-CM | POA: Diagnosis not present

## 2020-11-24 DIAGNOSIS — M5137 Other intervertebral disc degeneration, lumbosacral region: Secondary | ICD-10-CM | POA: Diagnosis not present

## 2020-11-28 DIAGNOSIS — M9903 Segmental and somatic dysfunction of lumbar region: Secondary | ICD-10-CM | POA: Diagnosis not present

## 2020-11-28 DIAGNOSIS — M4727 Other spondylosis with radiculopathy, lumbosacral region: Secondary | ICD-10-CM | POA: Diagnosis not present

## 2020-11-28 DIAGNOSIS — M5137 Other intervertebral disc degeneration, lumbosacral region: Secondary | ICD-10-CM | POA: Diagnosis not present

## 2020-11-28 DIAGNOSIS — M9904 Segmental and somatic dysfunction of sacral region: Secondary | ICD-10-CM | POA: Diagnosis not present

## 2021-03-17 DIAGNOSIS — Z1159 Encounter for screening for other viral diseases: Secondary | ICD-10-CM | POA: Diagnosis not present

## 2021-03-17 DIAGNOSIS — Z113 Encounter for screening for infections with a predominantly sexual mode of transmission: Secondary | ICD-10-CM | POA: Diagnosis not present

## 2021-03-17 DIAGNOSIS — Z13220 Encounter for screening for lipoid disorders: Secondary | ICD-10-CM | POA: Diagnosis not present

## 2021-03-17 DIAGNOSIS — Z0000 Encounter for general adult medical examination without abnormal findings: Secondary | ICD-10-CM | POA: Diagnosis not present

## 2021-03-17 DIAGNOSIS — E559 Vitamin D deficiency, unspecified: Secondary | ICD-10-CM | POA: Diagnosis not present

## 2021-03-17 DIAGNOSIS — E538 Deficiency of other specified B group vitamins: Secondary | ICD-10-CM | POA: Diagnosis not present

## 2021-04-06 DIAGNOSIS — Z124 Encounter for screening for malignant neoplasm of cervix: Secondary | ICD-10-CM | POA: Diagnosis not present

## 2021-04-06 DIAGNOSIS — Z01419 Encounter for gynecological examination (general) (routine) without abnormal findings: Secondary | ICD-10-CM | POA: Diagnosis not present

## 2021-04-21 DIAGNOSIS — R87810 Cervical high risk human papillomavirus (HPV) DNA test positive: Secondary | ICD-10-CM | POA: Diagnosis not present

## 2021-04-21 DIAGNOSIS — Z3202 Encounter for pregnancy test, result negative: Secondary | ICD-10-CM | POA: Diagnosis not present

## 2021-04-21 DIAGNOSIS — B977 Papillomavirus as the cause of diseases classified elsewhere: Secondary | ICD-10-CM | POA: Diagnosis not present

## 2021-05-26 DIAGNOSIS — F411 Generalized anxiety disorder: Secondary | ICD-10-CM | POA: Diagnosis not present

## 2021-05-26 DIAGNOSIS — F331 Major depressive disorder, recurrent, moderate: Secondary | ICD-10-CM | POA: Diagnosis not present

## 2021-07-07 DIAGNOSIS — F331 Major depressive disorder, recurrent, moderate: Secondary | ICD-10-CM | POA: Diagnosis not present

## 2021-07-07 DIAGNOSIS — F411 Generalized anxiety disorder: Secondary | ICD-10-CM | POA: Diagnosis not present

## 2021-08-06 DIAGNOSIS — F411 Generalized anxiety disorder: Secondary | ICD-10-CM | POA: Diagnosis not present

## 2021-08-06 DIAGNOSIS — F331 Major depressive disorder, recurrent, moderate: Secondary | ICD-10-CM | POA: Diagnosis not present

## 2021-10-05 DIAGNOSIS — F331 Major depressive disorder, recurrent, moderate: Secondary | ICD-10-CM | POA: Diagnosis not present

## 2021-10-05 DIAGNOSIS — F411 Generalized anxiety disorder: Secondary | ICD-10-CM | POA: Diagnosis not present

## 2021-12-17 ENCOUNTER — Emergency Department (HOSPITAL_COMMUNITY)
Admission: EM | Admit: 2021-12-17 | Discharge: 2021-12-17 | Disposition: A | Discharge status: Home / Self Care | Payer: 59 | Attending: Emergency Medicine | Admitting: Emergency Medicine

## 2021-12-17 ENCOUNTER — Other Ambulatory Visit: Payer: Self-pay

## 2021-12-17 ENCOUNTER — Encounter (HOSPITAL_COMMUNITY): Payer: Self-pay

## 2021-12-17 ENCOUNTER — Emergency Department (HOSPITAL_COMMUNITY): Payer: 59

## 2021-12-17 DIAGNOSIS — R1013 Epigastric pain: Secondary | ICD-10-CM | POA: Diagnosis present

## 2021-12-17 DIAGNOSIS — R1084 Generalized abdominal pain: Secondary | ICD-10-CM | POA: Diagnosis not present

## 2021-12-17 DIAGNOSIS — R110 Nausea: Secondary | ICD-10-CM | POA: Insufficient documentation

## 2021-12-17 LAB — COMPREHENSIVE METABOLIC PANEL
ALT: 24 U/L (ref 0–44)
AST: 19 U/L (ref 15–41)
Albumin: 4.3 g/dL (ref 3.5–5.0)
Alkaline Phosphatase: 47 U/L (ref 38–126)
Anion gap: 6 (ref 5–15)
BUN: 17 mg/dL (ref 6–20)
CO2: 24 mmol/L (ref 22–32)
Calcium: 9 mg/dL (ref 8.9–10.3)
Chloride: 108 mmol/L (ref 98–111)
Creatinine, Ser: 0.78 mg/dL (ref 0.44–1.00)
GFR, Estimated: >60 mL/min (ref >60)
Glucose, Bld: 110 mg/dL — ABNORMAL HIGH (ref 70–99)
Potassium: 3.9 mmol/L (ref 3.5–5.1)
Sodium: 138 mmol/L (ref 135–145)
Total Bilirubin: 0.5 mg/dL (ref 0.3–1.2)
Total Protein: 7.3 g/dL (ref 6.5–8.1)

## 2021-12-17 LAB — CBC
HCT: 40.3 % (ref 36.0–46.0)
Hemoglobin: 13.4 g/dL (ref 12.0–15.0)
MCH: 29.9 pg (ref 26.0–34.0)
MCHC: 33.3 g/dL (ref 30.0–36.0)
MCV: 90 fL (ref 80.0–100.0)
Platelets: 361 10*3/uL (ref 150–400)
RBC: 4.48 MIL/uL (ref 3.87–5.11)
RDW: 12.2 % (ref 11.5–15.5)
WBC: 14 10*3/uL — ABNORMAL HIGH (ref 4.0–10.5)
nRBC: 0 % (ref 0.0–0.2)

## 2021-12-17 LAB — URINALYSIS, ROUTINE W REFLEX MICROSCOPIC
Bilirubin Urine: NEGATIVE
Glucose, UA: NEGATIVE mg/dL
Hgb urine dipstick: NEGATIVE
Ketones, ur: NEGATIVE mg/dL
Leukocytes,Ua: NEGATIVE
Nitrite: NEGATIVE
Protein, ur: NEGATIVE mg/dL
Specific Gravity, Urine: 1.017 (ref 1.005–1.030)
pH: 7 (ref 5.0–8.0)

## 2021-12-17 LAB — I-STAT BETA HCG BLOOD, ED (MC, WL, AP ONLY): I-stat hCG, quantitative: <5 m[IU]/mL (ref <5)

## 2021-12-17 LAB — LIPASE, BLOOD: Lipase: 31 U/L (ref 11–51)

## 2021-12-17 MED ORDER — ONDANSETRON 4 MG PO TBDP: 4.0000 mg | ORAL_TABLET | Freq: Three times a day (TID) | ORAL | 0 refills | Status: AC | PRN

## 2021-12-17 MED ORDER — ONDANSETRON HCL 4 MG/2ML IJ SOLN
4.0000 mg | Freq: Once | INTRAMUSCULAR | Status: AC
Start: 2021-12-17 — End: 2021-12-17
  Administered 2021-12-17: 4 mg via INTRAVENOUS
  Filled 2021-12-17: qty 2

## 2021-12-17 MED ORDER — DICYCLOMINE HCL 10 MG PO CAPS
10.0000 mg | ORAL_CAPSULE | Freq: Once | ORAL | Status: AC
Start: 2021-12-17 — End: 2021-12-17
  Administered 2021-12-17: 10 mg via ORAL
  Filled 2021-12-17: qty 1

## 2021-12-17 MED ORDER — ALUM & MAG HYDROXIDE-SIMETH 200-200-20 MG/5ML PO SUSP
30.0000 mL | Freq: Once | ORAL | Status: AC
  Administered 2021-12-17: 30 mL via ORAL
  Filled 2021-12-17: qty 30

## 2021-12-17 MED ORDER — LIDOCAINE VISCOUS HCL 2 % MT SOLN
15.0000 mL | Freq: Once | OROMUCOSAL | Status: AC
  Administered 2021-12-17: 15 mL via ORAL
  Filled 2021-12-17: qty 15

## 2021-12-17 MED ORDER — SODIUM CHLORIDE 0.9 % IV BOLUS
1000.0000 mL | Freq: Once | INTRAVENOUS | Status: AC
  Administered 2021-12-17: 1000 mL via INTRAVENOUS

## 2021-12-17 MED ORDER — DICYCLOMINE HCL 20 MG PO TABS: 20.0000 mg | ORAL_TABLET | Freq: Two times a day (BID) | ORAL | 0 refills | Status: DC

## 2021-12-17 MED ORDER — IOHEXOL 300 MG/ML  SOLN
100.0000 mL | Freq: Once | INTRAMUSCULAR | Status: AC | PRN
  Administered 2021-12-17: 100 mL via INTRAVENOUS

## 2021-12-17 NOTE — Discharge Instructions (Signed)
As we discussed, your work-up in the ER today was reassuring for acute abnormalities.  Laboratory evaluation and imaging did not show any emergent concerns at this time.  Given that you had improvement in your symptoms with the Bentyl and Zofran, I have given you a prescription for same.  Please take these as prescribed as needed.  Follow-up with your PCP at your appointment tomorrow for continued evaluation and management of your symptoms.  Return if development of any new or worsening symptoms.

## 2021-12-17 NOTE — ED Provider Notes (Signed)
[facility] Provider Note   CSN: [id number] Arrival date & time: 12/17/21  1740     History  Chief Complaint  Patient presents with   Abdominal Pain   Nausea    Rachel Farmer is a 31 y.o. female.  Patient with no pertinent past medical history presents today with complaints of abdominal pain. She states that same came on suddenly yesterday afternoon in her epigastric region and RUQ and has been worsening since. She states that the pain is worsened with eating and relieved by belching and passing flatus. She also states that her stools have been softer than normal in the past day. She states that she is also nauseous without vomiting or diarrhea. She endorses a history of kidney stones in the past but states that this pain is much different. She states that her LMP was 3 years ago after she had an IUD placed. Denies any history of abdominal surgeries. Denies any hematochezia or melena.   The history is provided by the patient. No language interpreter was used.  Abdominal Pain Associated symptoms: nausea   Associated symptoms: no dysuria and no vaginal discharge        Home Medications Prior to Admission medications   Medication Sig Start Date End Date Taking? Authorizing Provider  DULoxetine (CYMBALTA) 60 MG capsule Take 60 mg by mouth daily.    [provider]  hydrOXYzine (ATARAX/VISTARIL) 10 MG tablet Take 10 mg by mouth daily as needed (anxiety).    [provider]  ondansetron (ZOFRAN ODT) 4 MG disintegrating tablet Take 1 tablet (4 mg total) by mouth every 8 (eight) hours as needed for nausea or vomiting. 02/14/20   Elpidio Anis, PA-C  oxyCODONE-acetaminophen (PERCOCET/ROXICET) 5-325 MG tablet Take 1-2 tablets by mouth every 4 (four) hours as needed for severe pain. 02/14/20   Elpidio Anis, PA-C  tamsulosin (FLOMAX) 0.4 MG CAPS capsule Take 1 capsule (0.4 mg total) by mouth daily after breakfast. 02/14/20   Elpidio Anis,  PA-C      Allergies    Augmentin [amoxicillin-pot clavulanate], Codeine, Ibuprofen, Lexapro [escitalopram oxalate], Lortab [hydrocodone-acetaminophen], and Zoloft [sertraline hcl]    Review of Systems   Review of Systems  Gastrointestinal:  Positive for abdominal pain and nausea.  Genitourinary:  Negative for dysuria and vaginal discharge.  All other systems reviewed and are negative.   Physical Exam Updated Vital Signs BP 137/76   Pulse 76   Temp 98.1 F (36.7 C) (Oral)   Resp (!) 24   Ht 5\' 6"  (1.676 m)   Wt 113.4 kg   SpO2 95%   BMI 40.35 kg/m  Physical Exam Vitals and nursing note reviewed.  Constitutional:      General: She is not in acute distress.    Appearance: Normal appearance. She is normal weight. She is not ill-appearing, toxic-appearing or diaphoretic.  HENT:     Head: Normocephalic and atraumatic.  Cardiovascular:     Rate and Rhythm: Normal rate.  Pulmonary:     Effort: Pulmonary effort is normal. No respiratory distress.  Abdominal:     General: Abdomen is flat.     Palpations: Abdomen is soft.     Tenderness: There is abdominal tenderness in the right upper quadrant and epigastric area.  Musculoskeletal:        General: Normal range of motion.     Cervical back: Normal range of motion.  Skin:    General: Skin is warm and dry.  Neurological:  General: No focal deficit present.     Mental Status: She is alert.  Psychiatric:        Mood and Affect: Mood normal.        Behavior: Behavior normal.     ED Results / Procedures / Treatments   Labs (all labs ordered are listed, but only abnormal results are displayed) Labs Reviewed  COMPREHENSIVE METABOLIC PANEL - Abnormal; Notable for the following components:      Result Value   Glucose, Bld 110 (*)    All other components within normal limits  CBC - Abnormal; Notable for the following components:   WBC 14.0 (*)    All other components within normal limits  URINALYSIS, ROUTINE W REFLEX  MICROSCOPIC - Abnormal; Notable for the following components:   APPearance CLOUDY (*)    All other components within normal limits  LIPASE, BLOOD  I-STAT BETA HCG BLOOD, ED ([facility], [facility], [facility] ONLY)    EKG None  Radiology CT ABDOMEN PELVIS W CONTRAST  Result Date: 12/17/2021 CLINICAL DATA:  Right side abdominal pain EXAM: CT ABDOMEN AND PELVIS WITH CONTRAST TECHNIQUE: Multidetector CT imaging of the abdomen and pelvis was performed using the standard protocol following bolus administration of intravenous contrast. RADIATION DOSE REDUCTION: This exam was performed according to the departmental dose-optimization program which includes automated exposure control, adjustment of the mA and/or kV according to patient size and/or use of iterative reconstruction technique. CONTRAST:  OMNIPAQUE IOHEXOL 300 MG/ML  SOLN COMPARISON:  02/12/2013 FINDINGS: Lower chest: No acute abnormality Hepatobiliary: No focal hepatic abnormality. Gallbladder unremarkable. Pancreas: No focal abnormality or ductal dilatation. Spleen: No focal abnormality.  Normal size. Adrenals/Urinary Tract: Punctate nonobstructing stone in the lower pole of the left kidney. No ureteral stones or hydronephrosis. Adrenal glands and urinary bladder unremarkable. Stomach/Bowel: Normal appendix. Stomach, large and small bowel grossly unremarkable. Vascular/Lymphatic: No evidence of aneurysm or adenopathy. Reproductive: Uterus and adnexa unremarkable. No mass. IUD in the uterus. Other: No free fluid or free air. Musculoskeletal: No acute bony abnormality. IMPRESSION: No acute findings in the abdomen or pelvis. Punctate left lower pole nephrolithiasis.  No hydronephrosis. Electronically Signed   By: Charlett Nose M.D.   On: 12/17/2021 20:18   US Abdomen Limited RUQ (LIVER/GB)  Result Date: 12/17/2021 CLINICAL DATA:  Right upper quadrant pain. EXAM: ULTRASOUND ABDOMEN LIMITED RIGHT UPPER QUADRANT COMPARISON:  Noncontrast CT 02/13/2020. FINDINGS:  Gallbladder: Physiologically distended. There multiple shadowing intraluminal gallstones. Shadowing partially obscures the posterior gallbladder wall. No gallbladder wall thickening or pericholecystic fluid. No sonographic Murphy sign noted by sonographer. Common bile duct: Diameter: 3-4 mm, normal. Liver: No focal lesion identified. Within normal limits in parenchymal echogenicity. Portal vein is patent on color Doppler imaging with normal direction of blood flow towards the liver. Other: No right upper quadrant ascites. IMPRESSION: 1. Gallstones without sonographic findings of acute cholecystitis. 2. No biliary dilatation. Electronically Signed   By: Narda Rutherford M.D.   On: 12/17/2021 19:44    Procedures Procedures    Medications Ordered in ED Medications  alum & mag hydroxide-simeth (MAALOX/MYLANTA) 200-200-20 MG/5ML suspension 30 mL (has no administration in time range)    And  lidocaine (XYLOCAINE) 2 % viscous mouth solution 15 mL (has no administration in time range)  sodium chloride 0.9 % bolus 1,000 mL (1,000 mLs Intravenous New Bag/Given 12/17/21 1918)  ondansetron (ZOFRAN) injection 4 mg (4 mg Intravenous Given 12/17/21 1917)  dicyclomine (BENTYL) capsule 10 mg (10 mg Oral Given 12/17/21 1916)  ED Course/ Medical Decision Making/ A&P                           Medical Decision Making Amount and/or Complexity of Data Reviewed Labs: ordered. Radiology: ordered.  Risk OTC drugs. Prescription drug management.   This patient presents to the ED for concern of abdominal pain, nausea, this involves an extensive number of treatment options, and is a complaint that carries with it a high risk of complications and morbidity.   Co morbidities that complicate the patient evaluation  Hx kidney stones   Lab Tests:  I Ordered, and personally interpreted labs.  The pertinent results include:  Glucose 110, WBC 14.  No other acute laboratory findings   Imaging Studies ordered:  I  ordered imaging studies including RUQ [place],  CT abdomen pelvis I independently visualized and interpreted imaging which showed  [place]: 1. Gallstones without sonographic findings of acute cholecystitis. No biliary dilatation. CT: No acute findings in the abdomen or pelvis. Punctate left lower pole nephrolithiasis.  No hydronephrosis. I agree with the radiologist interpretation   Problem List / ED Course / Critical interventions / Medication management  I ordered medication including fluids for dehydration, zofran for nausea, bentyl and GI cocktail for pain Reevaluation of the patient after these medicines showed that the patient resolved I have reviewed the patients home medicines and have made adjustments as needed  Test / Admission - Considered:  Patient presents today with complaints of RUQ abdominal pain. Patient is nontoxic, nonseptic appearing, in no apparent distress.  Patient's pain and other symptoms adequately managed in emergency department.  Fluid bolus given.  Labs, imaging and vitals reviewed.  Patient does not meet the SIRS or Sepsis criteria.  On repeat exam patient does not have a surgical abdomin and there are no peritoneal signs.  No indication of appendicitis, bowel obstruction, bowel perforation, cholecystitis, diverticulitis, PID or ectopic pregnancy. Upon reassessment, patient states that she feels much better after Bentyl and Zofran. No further emergent concerns at this time. Patient discharged home with symptomatic treatment including Zofran and Bentyl and given strict instructions for follow-up with their primary care physician. She states that she has an appointment with her PCP in the morning.  I have also discussed reasons to return immediately to the ER.  Patient expresses understanding and agrees with plan.  Discharged in stable condition.   Final Clinical Impression(s) / ED Diagnoses Final diagnoses:  Generalized abdominal pain    Rx / DC Orders ED Discharge Orders           Ordered    dicyclomine (BENTYL) 20 MG tablet  2 times daily        12/17/21 2216    ondansetron (ZOFRAN-ODT) 4 MG disintegrating tablet  Every 8 hours PRN        12/17/21 2216          An After Visit Summary was printed and given to the patient.     Vear Clock 12/17/21 2217    Alvira Monday, MD 12/18/21 1021

## 2021-12-17 NOTE — ED Triage Notes (Signed)
Patient c/o right mid and upper abdominal pain and nausea since yesterday.

## 2021-12-30 ENCOUNTER — Ambulatory Visit: Payer: Self-pay | Admitting: Surgery

## 2021-12-30 NOTE — H&P (Signed)
Rachel Farmer [id number]   Referring Provider:  Aliene Beams, MD   Subjective   Chief Complaint: Cholelithiasis     History of Present Illness: This is a very pleasant and otherwise healthy 31 year old woman who is following up after recent emergency room visit where she presented for severe right upper quadrant pain which came on acutely, radiated to the epigastric work region.  This was worsened by eating and relieved by belching and flatus.  She has had some intermittent epigastric pain in indigestion over the last several months, but this episode was quite severe.  Associated with nausea.  No fever, no jaundice.  She does have a history of kidney stones.  No previous abdominal surgery. Her labs on June 15 were notable only for a white count of 14, LFTs were normal. ReSound confirms gallstones with no sonographic findings of acute cholecystitis.  No biliary ductal dilatation. Her pain was able to be controlled in the emergency room and she was discharged. Since that time she has been maintaining a low-fat diet but still having intermittent episodes of less severe pain.     Review of Systems: A complete review of systems was obtained from the patient.  I have reviewed this information and discussed as appropriate with the patient.  See HPI as well for other ROS.   Medical History: Past Medical History:  Diagnosis Date   Anxiety     There is no problem list on file for this patient.   No past surgical history on file.   Allergies  Allergen Reactions   Ibuprofen Itching and Other (See Comments)    "makes me feel weird"   Codeine Other (See Comments)    Numb from the waste down  numbness  Numbness in legs   Hydrocodone-Acetaminophen Other (See Comments)    Numb from the waste down  numbness  Numbness in all extremities   Sertraline Other (See Comments)    depressed  headache  Chronic migraines   Escitalopram Oxalate Other (See Comments)    manic     Current Outpatient Medications on File Prior to Visit  Medication Sig Dispense Refill   cholecalciferol, vitamin D3, (VITAMIN D3) 125 mcg (5,000 unit) tablet as directed     CYMBALTA 60 mg DR capsule 2 capsules     fluticasone propionate (FLONASE) 50 mcg/actuation nasal spray 1 spray in each nostril     folic acid (FOLVITE) 400 MCG tablet 1 tablet     hydrOXYzine HCL (ATARAX) 10 MG tablet as directed     levonorgestrel (MIRENA IU)      mecobalamin, vitamin B12, 5,000 mcg Chew as directed     naproxen sodium 220 mg Cap 1 tablet with food or milk as needed     propranoloL (INDERAL) 10 MG tablet 1 tablet     No current facility-administered medications on file prior to visit.    Family History  Problem Relation Age of Onset   Hyperlipidemia (Elevated cholesterol) Mother    Diabetes Mother    Stroke Maternal Grandmother    High blood pressure (Hypertension) Maternal Grandmother    Hyperlipidemia (Elevated cholesterol) Maternal Grandmother    Breast cancer Maternal Grandmother      Social History   Tobacco Use  Smoking Status Never  Smokeless Tobacco Current     Social History   Socioeconomic History   Marital status: Single  Tobacco Use   Smoking status: Never   Smokeless tobacco: Current    Objective:    Vitals:  12/30/21 1008  BP: 114/70  Pulse: 110  Weight: (!) 118.1 kg (260 lb 6.4 oz)  Height: 167.6 cm (5\' 6" )    Body mass index is 42.03 kg/m.  Alert well-appearing Unlabored respirations Abdomen soft and nontender  Assessment and Plan:  Diagnoses and all orders for this visit:  Biliary colic    I recommend proceeding with laparoscopic or robotic cholecystectomy with possible cholangiogram.  Discussed the relevant anatomy using a diagram to demonstrate, and went over surgical technique.  Discussed risks of surgery including bleeding, pain, scarring, intraabdominal injury specifically to the common bile duct and sequelae, bile leak, conversion to  open surgery, failure to resolve symptoms, blood clots/ pulmonary embolus, heart attack, pneumonia, stroke, death. Questions welcomed and answered to patient's satisfaction.  We will schedule surgery as soon as possible.   No follow-ups on file.  Keveon Amsler , MD

## 2021-12-30 NOTE — H&P (View-Only) (Signed)
Rachel Farmer [id number]   Referring Provider:  Hagler, Rachel, MD   Subjective   Chief Complaint: Cholelithiasis     History of Present Illness: This is a very pleasant and otherwise healthy 30-year-old woman who is following up after recent emergency room visit where she presented for severe right upper quadrant pain which came on acutely, radiated to the epigastric work region.  This was worsened by eating and relieved by belching and flatus.  She has had some intermittent epigastric pain in indigestion over the last several months, but this episode was quite severe.  Associated with nausea.  No fever, no jaundice.  She does have a history of kidney stones.  No previous abdominal surgery. Her labs on June 15 were notable only for a white count of 14, LFTs were normal. ReSound confirms gallstones with no sonographic findings of acute cholecystitis.  No biliary ductal dilatation. Her pain was able to be controlled in the emergency room and she was discharged. Since that time she has been maintaining a low-fat diet but still having intermittent episodes of less severe pain.     Review of Systems: A complete review of systems was obtained from the patient.  I have reviewed this information and discussed as appropriate with the patient.  See HPI as well for other ROS.   Medical History: Past Medical History:  Diagnosis Date   Anxiety     There is no problem list on file for this patient.   No past surgical history on file.   Allergies  Allergen Reactions   Ibuprofen Itching and Other (See Comments)    "makes me feel weird"   Codeine Other (See Comments)    Numb from the waste down  numbness  Numbness in legs   Hydrocodone-Acetaminophen Other (See Comments)    Numb from the waste down  numbness  Numbness in all extremities   Sertraline Other (See Comments)    depressed  headache  Chronic migraines   Escitalopram Oxalate Other (See Comments)    manic     Current Outpatient Medications on File Prior to Visit  Medication Sig Dispense Refill   cholecalciferol, vitamin D3, (VITAMIN D3) 125 mcg (5,000 unit) tablet as directed     CYMBALTA 60 mg DR capsule 2 capsules     fluticasone propionate (FLONASE) 50 mcg/actuation nasal spray 1 spray in each nostril     folic acid (FOLVITE) 400 MCG tablet 1 tablet     hydrOXYzine HCL (ATARAX) 10 MG tablet as directed     levonorgestrel (MIRENA IU)      mecobalamin, vitamin B12, 5,000 mcg Chew as directed     naproxen sodium 220 mg Cap 1 tablet with food or milk as needed     propranoloL (INDERAL) 10 MG tablet 1 tablet     No current facility-administered medications on file prior to visit.    Family History  Problem Relation Age of Onset   Hyperlipidemia (Elevated cholesterol) Mother    Diabetes Mother    Stroke Maternal Grandmother    High blood pressure (Hypertension) Maternal Grandmother    Hyperlipidemia (Elevated cholesterol) Maternal Grandmother    Breast cancer Maternal Grandmother      Social History   Tobacco Use  Smoking Status Never  Smokeless Tobacco Current     Social History   Socioeconomic History   Marital status: Single  Tobacco Use   Smoking status: Never   Smokeless tobacco: Current    Objective:    Vitals:     12/30/21 1008  BP: 114/70  Pulse: 110  Weight: (!) 118.1 kg (260 lb 6.4 oz)  Height: 167.6 cm (5\' 6" )    Body mass index is 42.03 kg/m.  Alert well-appearing Unlabored respirations Abdomen soft and nontender  Assessment and Plan:  Diagnoses and all orders for this visit:  Biliary colic    I recommend proceeding with laparoscopic or robotic cholecystectomy with possible cholangiogram.  Discussed the relevant anatomy using a diagram to demonstrate, and went over surgical technique.  Discussed risks of surgery including bleeding, pain, scarring, intraabdominal injury specifically to the common bile duct and sequelae, bile leak, conversion to  open surgery, failure to resolve symptoms, blood clots/ pulmonary embolus, heart attack, pneumonia, stroke, death. Questions welcomed and answered to patient's satisfaction.  We will schedule surgery as soon as possible.   No follow-ups on file.  Brayon Bielefeld , MD

## 2022-01-18 NOTE — Patient Instructions (Signed)
DUE TO COVID-19 ONLY TWO VISITORS  (aged 32 and older)  ARE ALLOWED TO COME WITH YOU AND STAY IN THE WAITING ROOM ONLY DURING PRE OP AND PROCEDURE.   **NO VISITORS ARE ALLOWED IN THE SHORT STAY AREA OR RECOVERY ROOM!!**   Your procedure is scheduled on: 01/22/22   Report to [facility] Main Entrance    Report to admitting at 9:15 AM   Call this number if you have problems the morning of surgery [phone]   Do not eat food :After Midnight.   After Midnight you may have the following liquids until 8:30 AM DAY OF SURGERY  Water Black Coffee (sugar ok, NO MILK/CREAM OR CREAMERS)  Tea (sugar ok, NO MILK/CREAM OR CREAMERS) regular and decaf                             Plain Jell-O (NO RED)                                           Fruit ices (not with fruit pulp, NO RED)                                     Popsicles (NO RED)                                                                  Juice: apple, WHITE grape, WHITE cranberry Sports drinks like Gatorade (NO RED) Clear broth(vegetable,chicken,beef)  FOLLOW BOWEL PREP AND ANY ADDITIONAL PRE OP INSTRUCTIONS YOU RECEIVED FROM YOUR SURGEON'S OFFICE!!!     Oral Hygiene is also important to reduce your risk of infection.                                    Remember - BRUSH YOUR TEETH THE MORNING OF SURGERY WITH YOUR REGULAR TOOTHPASTE   Take these medicines the morning of surgery with A SIP OF WATER: Duloxetine, Zofran, Propranolol.                               You may not have any metal on your body including hair pins, jewelry, and body piercing             Do not wear make-up, lotions, powders, perfumes, or deodorant  Do not wear nail polish including gel and S&S, artificial/acrylic nails, or any other type of covering on natural nails including finger and toenails. If you have artificial nails, gel coating, etc. that needs to be removed by a nail salon please have this removed prior to surgery or surgery may need to be  canceled/ delayed if the surgeon/ anesthesia feels like they are unable to be safely monitored.   Do not shave  48 hours prior to surgery.    Do not bring valuables to the hospital. [facility] IS NOT  RESPONSIBLE   FOR VALUABLES.   Contacts, dentures or bridgework may not be worn into surgery.  DO NOT BRING YOUR HOME MEDICATIONS TO THE HOSPITAL. PHARMACY WILL DISPENSE MEDICATIONS LISTED ON YOUR MEDICATION LIST TO YOU DURING YOUR ADMISSION IN THE HOSPITAL!    Patients discharged on the day of surgery will not be allowed to drive home.  Someone NEEDS to stay with you for the first 24 hours after anesthesia.             Please read over the following fact sheets you were given: IF YOU HAVE QUESTIONS ABOUT YOUR PRE-OP INSTRUCTIONS PLEASE CALL [phone]- [facility] - Preparing for Surgery Before surgery, you can play an important role.  Because skin is not sterile, your skin needs to be as free of germs as possible.  You can reduce the number of germs on your skin by washing with CHG (chlorahexidine gluconate) soap before surgery.  CHG is an antiseptic cleaner which kills germs and bonds with the skin to continue killing germs even after washing. Please DO NOT use if you have an allergy to CHG or antibacterial soaps.  If your skin becomes reddened/irritated stop using the CHG and inform your nurse when you arrive at Short Stay. Do not shave (including legs and underarms) for at least 48 hours prior to the first CHG shower.  You may shave your face/neck.  Please follow these instructions carefully:  1.  Shower with CHG Soap the night before surgery and the  morning of surgery.  2.  If you choose to wash your hair, wash your hair first as usual with your normal  shampoo.  3.  After you shampoo, rinse your hair and body thoroughly to remove the shampoo.                             4.  Use CHG as you would any other liquid soap.  You can apply chg directly to the skin and  wash.  Gently with a scrungie or clean washcloth.  5.  Apply the CHG Soap to your body ONLY FROM THE NECK DOWN.   Do   not use on face/ open                           Wound or open sores. Avoid contact with eyes, ears mouth and   genitals (private parts).                       Wash face,  Genitals (private parts) with your normal soap.             6.  Wash thoroughly, paying special attention to the area where your    surgery  will be performed.  7.  Thoroughly rinse your body with warm water from the neck down.  8.  DO NOT shower/wash with your normal soap after using and rinsing off the CHG Soap.                9.  Pat yourself dry with a clean towel.            10.  Wear clean pajamas.            11.  Place clean sheets on your bed the night of your first shower and do not  sleep with pets. Day of Surgery :  Do not apply any lotions/deodorants the morning of surgery.  Please wear clean clothes to the hospital/surgery center.  FAILURE TO FOLLOW THESE INSTRUCTIONS MAY RESULT IN THE CANCELLATION OF YOUR SURGERY  PATIENT SIGNATURE_________________________________  NURSE SIGNATURE__________________________________  ________________________________________________________________________

## 2022-01-18 NOTE — Progress Notes (Signed)
COVID Vaccine Completed:  Date of COVID positive in last 90 days:  PCP - Aliene Beams, MD Cardiologist -   Chest x-ray -  EKG -  Stress Test -  ECHO -  Cardiac Cath -  Pacemaker/ICD device last checked: Spinal Cord Stimulator:  Bowel Prep -   Sleep Study -  CPAP -   Fasting Blood Sugar -  Checks Blood Sugar _____ times a day  Blood Thinner Instructions: Aspirin Instructions: Last Dose:  Activity level:  Can go up a flight of stairs and perform activities of daily living without stopping and without symptoms of chest pain or shortness of breath.  Able to exercise without symptoms  Unable to go up a flight of stairs without symptoms of     Anesthesia review:   Patient denies shortness of breath, fever, cough and chest pain at PAT appointment  Patient verbalized understanding of instructions that were given to them at the PAT appointment. Patient was also instructed that they will need to review over the PAT instructions again at home before surgery.

## 2022-01-19 ENCOUNTER — Encounter (HOSPITAL_COMMUNITY): Payer: Self-pay

## 2022-01-19 ENCOUNTER — Encounter (HOSPITAL_COMMUNITY)
Admission: RE | Admit: 2022-01-19 | Discharge: 2022-01-19 | Disposition: A | Discharge status: Home / Self Care | Payer: 59 | Source: Ambulatory Visit | Attending: Surgery | Admitting: Surgery

## 2022-01-19 VITALS — BP 135/90 | HR 82 | Temp 98.7°F | Resp 12 | Ht 66.0 in | Wt 260.4 lb

## 2022-01-19 DIAGNOSIS — Z01818 Encounter for other preprocedural examination: Secondary | ICD-10-CM

## 2022-01-19 DIAGNOSIS — Z01812 Encounter for preprocedural laboratory examination: Secondary | ICD-10-CM | POA: Insufficient documentation

## 2022-01-19 HISTORY — DX: Headache, unspecified: R51.9

## 2022-01-19 LAB — CBC
HCT: 41 % (ref 36.0–46.0)
Hemoglobin: 13.4 g/dL (ref 12.0–15.0)
MCH: 29.5 pg (ref 26.0–34.0)
MCHC: 32.7 g/dL (ref 30.0–36.0)
MCV: 90.1 fL (ref 80.0–100.0)
Platelets: 298 10*3/uL (ref 150–400)
RBC: 4.55 MIL/uL (ref 3.87–5.11)
RDW: 12.6 % (ref 11.5–15.5)
WBC: 10.6 10*3/uL — ABNORMAL HIGH (ref 4.0–10.5)
nRBC: 0 % (ref 0.0–0.2)

## 2022-01-22 ENCOUNTER — Ambulatory Visit (HOSPITAL_BASED_OUTPATIENT_CLINIC_OR_DEPARTMENT_OTHER): Payer: 59 | Admitting: Anesthesiology

## 2022-01-22 ENCOUNTER — Ambulatory Visit (HOSPITAL_COMMUNITY): Payer: 59 | Admitting: Anesthesiology

## 2022-01-22 ENCOUNTER — Encounter (HOSPITAL_COMMUNITY)
Admission: RE | Disposition: A | Discharge status: Home / Self Care | Payer: Self-pay | Source: Ambulatory Visit | Attending: Surgery

## 2022-01-22 ENCOUNTER — Ambulatory Visit (HOSPITAL_COMMUNITY)
Admission: RE | Admit: 2022-01-22 | Discharge: 2022-01-22 | Disposition: A | Discharge status: Home / Self Care | Payer: 59 | Source: Ambulatory Visit | Attending: Surgery | Admitting: Surgery

## 2022-01-22 ENCOUNTER — Other Ambulatory Visit: Payer: Self-pay

## 2022-01-22 ENCOUNTER — Encounter (HOSPITAL_COMMUNITY): Payer: Self-pay | Admitting: Surgery

## 2022-01-22 DIAGNOSIS — F17290 Nicotine dependence, other tobacco product, uncomplicated: Secondary | ICD-10-CM | POA: Diagnosis not present

## 2022-01-22 DIAGNOSIS — F32A Depression, unspecified: Secondary | ICD-10-CM | POA: Insufficient documentation

## 2022-01-22 DIAGNOSIS — Z6841 Body Mass Index (BMI) 40.0 and over, adult: Secondary | ICD-10-CM

## 2022-01-22 DIAGNOSIS — Z87442 Personal history of urinary calculi: Secondary | ICD-10-CM | POA: Diagnosis not present

## 2022-01-22 DIAGNOSIS — K8010 Calculus of gallbladder with chronic cholecystitis without obstruction: Secondary | ICD-10-CM | POA: Diagnosis not present

## 2022-01-22 DIAGNOSIS — K8050 Calculus of bile duct without cholangitis or cholecystitis without obstruction: Secondary | ICD-10-CM | POA: Diagnosis not present

## 2022-01-22 DIAGNOSIS — F418 Other specified anxiety disorders: Secondary | ICD-10-CM

## 2022-01-22 DIAGNOSIS — F419 Anxiety disorder, unspecified: Secondary | ICD-10-CM | POA: Diagnosis not present

## 2022-01-22 HISTORY — PX: CHOLECYSTECTOMY: SHX55

## 2022-01-22 LAB — POCT PREGNANCY, URINE: Preg Test, Ur: NEGATIVE

## 2022-01-22 SURGERY — LAPAROSCOPIC CHOLECYSTECTOMY
Anesthesia: General

## 2022-01-22 MED ORDER — SCOPOLAMINE 1 MG/3DAYS TD PT72
MEDICATED_PATCH | TRANSDERMAL | Status: AC
  Filled 2022-01-22: qty 1

## 2022-01-22 MED ORDER — BUPIVACAINE-EPINEPHRINE (PF) 0.25% -1:200000 IJ SOLN
INTRAMUSCULAR | Status: AC
  Filled 2022-01-22: qty 30

## 2022-01-22 MED ORDER — ONDANSETRON HCL 4 MG/2ML IJ SOLN
INTRAMUSCULAR | Status: DC | PRN
  Administered 2022-01-22: 4 mg via INTRAVENOUS

## 2022-01-22 MED ORDER — ORAL CARE MOUTH RINSE: 15.0000 mL | Freq: Once | OROMUCOSAL | Status: AC

## 2022-01-22 MED ORDER — ONDANSETRON HCL 4 MG/2ML IJ SOLN
INTRAMUSCULAR | Status: AC
  Filled 2022-01-22: qty 2

## 2022-01-22 MED ORDER — 0.9 % SODIUM CHLORIDE (POUR BTL) OPTIME
TOPICAL | Status: DC | PRN
  Administered 2022-01-22: 1000 mL

## 2022-01-22 MED ORDER — DEXAMETHASONE SODIUM PHOSPHATE 10 MG/ML IJ SOLN
INTRAMUSCULAR | Status: DC | PRN
  Administered 2022-01-22: 10 mg via INTRAVENOUS

## 2022-01-22 MED ORDER — SCOPOLAMINE 1 MG/3DAYS TD PT72
1.0000 | MEDICATED_PATCH | TRANSDERMAL | Status: DC
  Administered 2022-01-22: 1.5 mg via TRANSDERMAL

## 2022-01-22 MED ORDER — ROCURONIUM BROMIDE 10 MG/ML (PF) SYRINGE
PREFILLED_SYRINGE | INTRAVENOUS | Status: AC
  Filled 2022-01-22: qty 10

## 2022-01-22 MED ORDER — FENTANYL CITRATE (PF) 100 MCG/2ML IJ SOLN
INTRAMUSCULAR | Status: AC
  Filled 2022-01-22: qty 2

## 2022-01-22 MED ORDER — INDOCYANINE GREEN 25 MG IV SOLR
2.5000 mg | Freq: Once | INTRAVENOUS | Status: AC
  Administered 2022-01-22: 2.5 mg via INTRAVENOUS
  Filled 2022-01-22: qty 1

## 2022-01-22 MED ORDER — LIDOCAINE HCL (PF) 2 % IJ SOLN
INTRAMUSCULAR | Status: AC
  Filled 2022-01-22: qty 5

## 2022-01-22 MED ORDER — ACETAMINOPHEN 650 MG RE SUPP: 650.0000 mg | RECTAL | Status: DC | PRN

## 2022-01-22 MED ORDER — SODIUM CHLORIDE 0.9 % IV SOLN: 250.0000 mL | INTRAVENOUS | Status: DC | PRN

## 2022-01-22 MED ORDER — SUGAMMADEX SODIUM 500 MG/5ML IV SOLN
INTRAVENOUS | Status: AC
  Filled 2022-01-22: qty 5

## 2022-01-22 MED ORDER — SODIUM CHLORIDE 0.9% FLUSH: 3.0000 mL | Freq: Two times a day (BID) | INTRAVENOUS | Status: DC

## 2022-01-22 MED ORDER — PROPOFOL 10 MG/ML IV BOLUS
INTRAVENOUS | Status: DC | PRN
  Administered 2022-01-22: 170 mg via INTRAVENOUS

## 2022-01-22 MED ORDER — ONDANSETRON HCL 4 MG/2ML IJ SOLN
4.0000 mg | Freq: Once | INTRAMUSCULAR | Status: AC | PRN
Start: 2022-01-22 — End: 2022-01-22
  Administered 2022-01-22: 4 mg via INTRAVENOUS

## 2022-01-22 MED ORDER — GABAPENTIN 300 MG PO CAPS
300.0000 mg | ORAL_CAPSULE | ORAL | Status: AC
  Administered 2022-01-22: 300 mg via ORAL
  Filled 2022-01-22: qty 1

## 2022-01-22 MED ORDER — ACETAMINOPHEN 500 MG PO TABS
ORAL_TABLET | ORAL | Status: AC
  Administered 2022-01-22: 1000 mg via ORAL
  Filled 2022-01-22: qty 2

## 2022-01-22 MED ORDER — BUPIVACAINE-EPINEPHRINE 0.25% -1:200000 IJ SOLN
INTRAMUSCULAR | Status: DC | PRN
  Administered 2022-01-22: 30 mL

## 2022-01-22 MED ORDER — LACTATED RINGERS IV SOLN: INTRAVENOUS | Status: DC

## 2022-01-22 MED ORDER — DEXAMETHASONE SODIUM PHOSPHATE 10 MG/ML IJ SOLN
INTRAMUSCULAR | Status: AC
Start: 2022-01-22 — End: ?
  Filled 2022-01-22: qty 1

## 2022-01-22 MED ORDER — DOCUSATE SODIUM 100 MG PO CAPS: 100.0000 mg | ORAL_CAPSULE | Freq: Two times a day (BID) | ORAL | 0 refills | Status: AC

## 2022-01-22 MED ORDER — FENTANYL CITRATE PF 50 MCG/ML IJ SOSY: 25.0000 ug | PREFILLED_SYRINGE | INTRAMUSCULAR | Status: DC | PRN

## 2022-01-22 MED ORDER — MIDAZOLAM HCL 5 MG/5ML IJ SOLN
INTRAMUSCULAR | Status: DC | PRN
  Administered 2022-01-22: 2 mg via INTRAVENOUS

## 2022-01-22 MED ORDER — FENTANYL CITRATE PF 50 MCG/ML IJ SOSY
25.0000 ug | PREFILLED_SYRINGE | INTRAMUSCULAR | Status: DC | PRN
  Administered 2022-01-22: 50 ug via INTRAVENOUS

## 2022-01-22 MED ORDER — FENTANYL CITRATE (PF) 250 MCG/5ML IJ SOLN
INTRAMUSCULAR | Status: DC | PRN
  Administered 2022-01-22 (×2): 50 ug via INTRAVENOUS
  Administered 2022-01-22: 100 ug via INTRAVENOUS

## 2022-01-22 MED ORDER — PROPOFOL 10 MG/ML IV BOLUS
INTRAVENOUS | Status: AC
  Filled 2022-01-22: qty 20

## 2022-01-22 MED ORDER — TRAMADOL HCL 50 MG PO TABS: 50.0000 mg | ORAL_TABLET | Freq: Four times a day (QID) | ORAL | 0 refills | Status: AC | PRN

## 2022-01-22 MED ORDER — MIDAZOLAM HCL 2 MG/2ML IJ SOLN
INTRAMUSCULAR | Status: AC
  Filled 2022-01-22: qty 2

## 2022-01-22 MED ORDER — CHLORHEXIDINE GLUCONATE 4 % EX LIQD: 60.0000 mL | Freq: Once | CUTANEOUS | Status: DC

## 2022-01-22 MED ORDER — SUGAMMADEX SODIUM 500 MG/5ML IV SOLN
INTRAVENOUS | Status: DC | PRN
  Administered 2022-01-22: 300 mg via INTRAVENOUS

## 2022-01-22 MED ORDER — ROCURONIUM BROMIDE 10 MG/ML (PF) SYRINGE
PREFILLED_SYRINGE | INTRAVENOUS | Status: DC | PRN
  Administered 2022-01-22: 10 mg via INTRAVENOUS
  Administered 2022-01-22: 60 mg via INTRAVENOUS

## 2022-01-22 MED ORDER — LACTATED RINGERS IR SOLN
Status: DC | PRN
  Administered 2022-01-22: 1000 mL

## 2022-01-22 MED ORDER — CHLORHEXIDINE GLUCONATE 0.12 % MT SOLN
15.0000 mL | Freq: Once | OROMUCOSAL | Status: AC
  Administered 2022-01-22: 15 mL via OROMUCOSAL

## 2022-01-22 MED ORDER — CEFAZOLIN SODIUM-DEXTROSE 2-4 GM/100ML-% IV SOLN
2.0000 g | INTRAVENOUS | Status: AC
  Administered 2022-01-22: 2 g via INTRAVENOUS
  Filled 2022-01-22: qty 100

## 2022-01-22 MED ORDER — SODIUM CHLORIDE 0.9% FLUSH: 3.0000 mL | INTRAVENOUS | Status: DC | PRN

## 2022-01-22 MED ORDER — FENTANYL CITRATE PF 50 MCG/ML IJ SOSY
PREFILLED_SYRINGE | INTRAMUSCULAR | Status: AC
  Filled 2022-01-22: qty 2

## 2022-01-22 MED ORDER — SUCCINYLCHOLINE CHLORIDE 200 MG/10ML IV SOSY
PREFILLED_SYRINGE | INTRAVENOUS | Status: AC
  Filled 2022-01-22: qty 10

## 2022-01-22 MED ORDER — BUPIVACAINE LIPOSOME 1.3 % IJ SUSP: 20.0000 mL | Freq: Once | INTRAMUSCULAR | Status: DC

## 2022-01-22 MED ORDER — OXYCODONE HCL 5 MG PO TABS: 5.0000 mg | ORAL_TABLET | ORAL | Status: DC | PRN

## 2022-01-22 MED ORDER — ACETAMINOPHEN 500 MG PO TABS: 1000.0000 mg | ORAL_TABLET | ORAL | Status: AC

## 2022-01-22 MED ORDER — ACETAMINOPHEN 325 MG PO TABS: 650.0000 mg | ORAL_TABLET | ORAL | Status: DC | PRN

## 2022-01-22 SURGICAL SUPPLY — 41 items
APPLIER CLIP ROT 10 11.4 M/L (STAPLE) ×2
BAG COUNTER SPONGE SURGICOUNT (BAG) IMPLANT
CABLE HIGH FREQUENCY MONO STRZ (ELECTRODE) ×2 IMPLANT
CHLORAPREP W/TINT 26 (MISCELLANEOUS) ×2 IMPLANT
CLIP APPLIE ROT 10 11.4 M/L (STAPLE) ×1 IMPLANT
COVER MAYO STAND STRL (DRAPES) IMPLANT
COVER SURGICAL LIGHT HANDLE (MISCELLANEOUS) ×2 IMPLANT
DERMABOND ADVANCED (GAUZE/BANDAGES/DRESSINGS) ×1
DERMABOND ADVANCED .7 DNX12 (GAUZE/BANDAGES/DRESSINGS) ×1 IMPLANT
DRAPE C-ARM 42X120 X-RAY (DRAPES) IMPLANT
ELECT REM PT RETURN 15FT ADLT (MISCELLANEOUS) ×2 IMPLANT
GLOVE BIO SURGEON STRL SZ 6 (GLOVE) ×2 IMPLANT
GLOVE INDICATOR 6.5 STRL GRN (GLOVE) ×2 IMPLANT
GLOVE SS BIOGEL STRL SZ 6 (GLOVE) ×1 IMPLANT
GLOVE SUPERSENSE BIOGEL SZ 6 (GLOVE) ×1
GOWN STRL REUS W/ TWL LRG LVL3 (GOWN DISPOSABLE) ×1 IMPLANT
GOWN STRL REUS W/ TWL XL LVL3 (GOWN DISPOSABLE) IMPLANT
GOWN STRL REUS W/TWL LRG LVL3 (GOWN DISPOSABLE) ×1
GOWN STRL REUS W/TWL XL LVL3 (GOWN DISPOSABLE)
GRASPER SUT TROCAR 14GX15 (MISCELLANEOUS) ×2 IMPLANT
HEMOSTAT SNOW SURGICEL 2X4 (HEMOSTASIS) IMPLANT
IRRIG SUCT STRYKERFLOW 2 WTIP (MISCELLANEOUS) ×2
IRRIGATION SUCT STRKRFLW 2 WTP (MISCELLANEOUS) ×1 IMPLANT
KIT BASIN OR (CUSTOM PROCEDURE TRAY) ×2 IMPLANT
KIT TURNOVER KIT A (KITS) IMPLANT
NDL INSUFFLATION 14GA 120MM (NEEDLE) ×1 IMPLANT
NEEDLE INSUFFLATION 14GA 120MM (NEEDLE) ×2 IMPLANT
PENCIL SMOKE EVACUATOR (MISCELLANEOUS) IMPLANT
SCISSORS LAP 5X35 DISP (ENDOMECHANICALS) ×2 IMPLANT
SET CHOLANGIOGRAPH MIX (MISCELLANEOUS) IMPLANT
SET TUBE SMOKE EVAC HIGH FLOW (TUBING) ×2 IMPLANT
SLEEVE Z-THREAD 5X100MM (TROCAR) ×4 IMPLANT
SPIKE FLUID TRANSFER (MISCELLANEOUS) ×2 IMPLANT
SUT MNCRL AB 4-0 PS2 18 (SUTURE) ×2 IMPLANT
SYS BAG RETRIEVAL 10MM (BASKET) ×2
SYSTEM BAG RETRIEVAL 10MM (BASKET) ×1 IMPLANT
TOWEL OR 17X26 10 PK STRL BLUE (TOWEL DISPOSABLE) ×2 IMPLANT
TOWEL OR NON WOVEN STRL DISP B (DISPOSABLE) IMPLANT
TRAY LAPAROSCOPIC (CUSTOM PROCEDURE TRAY) ×2 IMPLANT
TROCAR ADV FIXATION 12X100MM (TROCAR) ×2 IMPLANT
TROCAR Z-THREAD OPTICAL 5X100M (TROCAR) ×2 IMPLANT

## 2022-01-22 NOTE — Anesthesia Preprocedure Evaluation (Addendum)
Anesthesia Evaluation  Patient identified by MRN, date of birth, ID band Patient awake    Reviewed: Allergy & Precautions, NPO status , Patient's Chart, lab work & pertinent test results  Airway Mallampati: II  TM Distance: >3 FB Neck ROM: Full    Dental  (+) Teeth Intact, Dental Advisory Given, Chipped,    Pulmonary Current Smoker (Vape) and Patient abstained from smoking.,    Pulmonary exam normal breath sounds clear to auscultation       Cardiovascular Exercise Tolerance: Good negative cardio ROS Normal cardiovascular exam Rhythm:Regular Rate:Normal     Neuro/Psych  Headaches, PSYCHIATRIC DISORDERS Anxiety Depression    GI/Hepatic negative GI ROS, BILIARY COLIC   Endo/Other  Morbid obesity  Renal/GU negative Renal ROS     Musculoskeletal negative musculoskeletal ROS (+)   Abdominal   Peds  Hematology negative hematology ROS (+)   Anesthesia Other Findings Day of surgery medications reviewed with the patient.  Reproductive/Obstetrics negative OB ROS                            Anesthesia Physical Anesthesia Plan  ASA: 3  Anesthesia Plan: General   Post-op Pain Management: Tylenol PO (pre-op)*   Induction: Intravenous  PONV Risk Score and Plan: 4 or greater and Midazolam, Ondansetron and Dexamethasone  Airway Management Planned: Oral ETT  Additional Equipment:   Intra-op Plan:   Post-operative Plan: Extubation in OR  Informed Consent: I have reviewed the patients History and Physical, chart, labs and discussed the procedure including the risks, benefits and alternatives for the proposed anesthesia with the patient or authorized representative who has indicated his/her understanding and acceptance.     Dental advisory given  Plan Discussed with: CRNA  Anesthesia Plan Comments:         Anesthesia Quick Evaluation

## 2022-01-22 NOTE — Anesthesia Procedure Notes (Signed)
Procedure Name: Intubation Date/Time: 01/22/2022 9:47 AM  Performed by: Felis Quillin D, CRNAPre-anesthesia Checklist: Patient identified, Emergency Drugs available, Suction available and Patient being monitored Patient Re-evaluated:Patient Re-evaluated prior to induction Oxygen Delivery Method: Circle system utilized Preoxygenation: Pre-oxygenation with 100% oxygen Induction Type: IV induction Ventilation: Mask ventilation without difficulty Laryngoscope Size: Mac and 4 Grade View: Grade I Tube type: Oral Tube size: 7.0 mm Number of attempts: 1 Airway Equipment and Method: Stylet and Oral airway Placement Confirmation: ETT inserted through vocal cords under direct vision, positive ETCO2 and breath sounds checked- equal and bilateral Secured at: 21 cm Tube secured with: Tape Dental Injury: Teeth and Oropharynx as per pre-operative assessment

## 2022-01-22 NOTE — Interval H&P Note (Signed)
History and Physical Interval Note:  01/22/2022 9:15 AM  Rachel Farmer  has presented today for surgery, with the diagnosis of BILIARY COLIC.  The various methods of treatment have been discussed with the patient and family. After consideration of risks, benefits and other options for treatment, the patient has consented to  Procedure(s): LAPAROSCOPIC CHOLECYSTECTOMY (N/A) as a surgical intervention.  The patient's history has been reviewed, patient examined, no change in status, stable for surgery.  I have reviewed the patient's chart and labs.  Questions were answered to the patient's satisfaction.     Kamila Broda Lollie Sails

## 2022-01-22 NOTE — Anesthesia Postprocedure Evaluation (Signed)
Anesthesia Post Note  Patient: [name]  Procedure(s) Performed: LAPAROSCOPIC CHOLECYSTECTOMY     Patient location during evaluation: PACU Anesthesia Type: General Level of consciousness: awake and alert Pain management: pain level controlled Vital Signs Assessment: post-procedure vital signs reviewed and stable Respiratory status: spontaneous breathing, nonlabored ventilation, respiratory function stable and patient connected to nasal cannula oxygen Cardiovascular status: blood pressure returned to baseline and stable Postop Assessment: no apparent nausea or vomiting Anesthetic complications: no   No notable events documented.  Last Vitals:  Vitals:   01/22/22 1245 01/22/22 1300  BP:  127/80  Pulse: 77 76  Resp: (!) 22   Temp:    SpO2: 92% 96%    Last Pain:  Vitals:   01/22/22 1300  TempSrc:   PainSc: 4                  Collene Schlichter

## 2022-01-22 NOTE — Transfer of Care (Signed)
Immediate Anesthesia Transfer of Care Note  Patient: Rachel Farmer  Procedure(s) Performed: LAPAROSCOPIC CHOLECYSTECTOMY  Patient Location: PACU  Anesthesia Type:General  Level of Consciousness: awake, alert  and oriented  Airway & Oxygen Therapy: Patient Spontanous Breathing and Patient connected to face mask oxygen  Post-op Assessment: Report given to RN and Post -op Vital signs reviewed and stable  Post vital signs: Reviewed and stable  Last Vitals:  Vitals Value Taken Time  BP 163/94 01/22/22 1115  Temp    Pulse 78 01/22/22 1115  Resp 30 01/22/22 1115  SpO2 100 % 01/22/22 1115  Vitals shown include unvalidated device data.  Last Pain:  Vitals:   01/22/22 0813  TempSrc:   PainSc: 0-No pain         Complications: No notable events documented.

## 2022-01-22 NOTE — Op Note (Signed)
Operative Note  ANDERSEN MCKIVER 30 y.o. female [id number]  01/22/2022  Surgeon: Berna Bue MD FACS  Assistant: Zena Amos MD ([id number]). I was personally present during the key and critical portions of this procedure and immediately available throughout the entire procedure, as documented in my operative note.  Procedure performed: Laparoscopic Cholecystectomy  Preop diagnosis: biliary colic Post-op diagnosis/intraop findings: same  Specimens: gallbladder  Retained items: none  EBL: minimal  Complications: none  Description of procedure: After obtaining informed consent the patient was brought to the operating room. Antibiotics were administered. SCD's were applied. General endotracheal anesthesia was initiated and a formal time-out was performed. The abdomen was prepped and draped in the usual sterile fashion and the abdomen was entered using visiport technique in the left upper quadrant after instilling the site with local. Insufflation to was obtained, and gross inspection revealed no evidence of injury from our entry or other intraabdominal abnormalities. Two 73mm trocars were introduced in the right midclavicular and right anterior axillary lines under direct visualization and following infiltration with local. An 76mm trocar was placed in the epigastrium. The entry port was removed and this trocar was inserted in the infraumbilical location.  The gallbladder was retracted cephalad and the infundibulum was retracted laterally. A combination of hook electrocautery and blunt dissection was utilized to clear the peritoneum from the neck and cystic duct, circumferentially isolating the cystic artery and cystic duct and lifting the gallbladder from the cystic plate. The critical view of safety was achieved with the cystic artery, cystic duct, and liver bed visualized between them with no other structures. 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 The cystic artery was clipped with two clips proximally and one distally and divided as was the cystic duct with four clips on the proximal end. The gallbladder was dissected from the liver plate using electrocautery. A small posterior branch along the gallbladder fossa as also singly clipped and divided. Once freed the gallbladder was placed in an endocatch bag and removed through the epigastric trocar site.  The liver bed was hemostatic. Some bile had been spilled from the gallbladder during its dissection from the liver bed. This was aspirated and the right upper quadrant was irrigated; the effluent was clear. Hemostasis was once again confirmed, and reinspection of the abdomen revealed no injuries. The clips were well opposed without any bile leak from the cystic duct stump or the liver bed. The 65mm trocar site in the epigastrium was closed with a 0 vicryl in the fascia under direct visualization using a PMI device. The abdomen was desufflated and all trocars removed. The skin incisions were closed with subcuticular 4-0 monocryl and Dermabond. The patient was awakened, extubated and transported to the recovery room in stable condition.    All counts were correct at the completion of the case.  Picture taken per patient request:

## 2022-01-22 NOTE — Discharge Instructions (Signed)
LAPAROSCOPIC SURGERY: POST OP INSTRUCTIONS   EAT Gradually transition to a high fiber diet with a fiber supplement over the next few weeks after discharge.  Start with a pureed / full liquid diet (see below)  WALK Walk an hour a day (cumulative- not all at once).  Control your pain to do that.    CONTROL PAIN Control pain so that you can walk, sleep, tolerate sneezing/coughing, go up/down stairs.  HAVE A BOWEL MOVEMENT DAILY Keep your bowels regular to avoid problems.  OK to try a laxative to override constipation.  OK to use an antidairrheal to slow down diarrhea.  Call if not better after 2 tries  CALL IF YOU HAVE PROBLEMS/CONCERNS Call if you are still struggling despite following these instructions. Call if you have concerns not answered by these instructions    DIET: Follow a light bland diet & liquids the first 24 hours after arrival home, such as soup, liquids, starches, etc.  Be sure to drink plenty of fluids.  Quickly advance to a usual solid diet within a few days.  Avoid fast food or heavy meals as your are more likely to get nauseated or have irregular bowels.  A low-sugar, high-fiber diet for the rest of your life is ideal.  Take your usually prescribed home medications unless otherwise directed.  PAIN CONTROL: Pain is best controlled by a usual combination of three different methods TOGETHER: Ice/Heat Over the counter pain medication Prescription pain medication Most patients will experience some swelling and bruising around the incisions.  Ice packs or heating pads (30-60 minutes up to 6 times a day) will help. Use ice for the first few days to help decrease swelling and bruising, then switch to heat to help relax tight/sore spots and speed recovery.  Some people prefer to use ice alone, heat alone, alternating between ice & heat.  Experiment to what works for you.  Swelling and bruising can take several weeks to resolve.   It is helpful to take an over-the-counter pain  medication regularly for the first few days: Naproxen (Aleve, etc)  Two 220mg  tabs twice a day OR Ibuprofen (Advil, etc) Three 200mg  tabs four times a day (every meal & bedtime) AND Acetaminophen (Tylenol, etc) 500-650mg  four times a day (every meal & bedtime) A  prescription for pain medication (such as oxycodone, hydrocodone, tramadol, gabapentin, methocarbamol, etc) should be given to you upon discharge.  Take your pain medication as prescribed, IF NEEDED.  If you are having problems/concerns with the prescription medicine (does not control pain, nausea, vomiting, rash, itching, etc), please call us [phone] to see if we need to switch you to a different pain medicine that will work better for you and/or control your side effect better. If you need a refill on your pain medication, please give [place] 48 hour notice.  contact your pharmacy.  They will contact our office to request authorization. Prescriptions will not be filled after 5 pm or on week-ends  Avoid getting constipated.   Between the surgery and the pain medications, it is common to experience some constipation.   Increasing fluid intake and taking a fiber supplement (such as Metamucil, Citrucel, FiberCon, MiraLax, etc) 1-2 times a day regularly will usually help prevent this problem from occurring.   A mild laxative (prune juice, Milk of Magnesia, MiraLax, etc) should be taken according to package directions if there are no bowel movements after 48 hours.   Watch out for diarrhea.   If you have many loose  bowel movements, simplify your diet to bland foods & liquids for a few days.   Stop any stool softeners and decrease your fiber supplement.   Switching to mild anti-diarrheal medications (Kayopectate, Pepto Bismol) can help.   If this worsens or does not improve, please call us.  Wash / shower every day.  You may shower over the skin glue which is waterproof. No rubbing, scrubbing, lotions or ointments to incisions. Do not  soak/submerge incisions.  Glue will flake off after about 2 weeks.  You may leave the incisions open to air.  You may replace a dressing/Band-Aid to cover the incision for comfort if you wish.   ACTIVITIES as tolerated:   You may resume regular (light) daily activities beginning the next day--such as daily self-care, walking, climbing stairs--gradually increasing activities as tolerated.  If you can walk 30 minutes without difficulty, it is safe to try more intense activity such as jogging, treadmill, bicycling, low-impact aerobics, swimming, etc. Save the most intensive and strenuous activity for last such as sit-ups, heavy lifting, contact sports, etc  Refrain from any heavy lifting or straining until you are off narcotics for pain control.   DO NOT PUSH THROUGH PAIN.  Let pain be your guide: If it hurts to do something, don't do it.  Pain is your body warning you to avoid that activity for another week until the pain goes down. You may drive when you are no longer taking prescription pain medication, you can comfortably wear a seatbelt, and you can safely maneuver your car and apply brakes. You may have sexual intercourse when it is comfortable.  FOLLOW UP in our office Please call [facility] at [phone] to set up an appointment to see your surgeon in the office for a follow-up appointment approximately 2-3 weeks after your surgery. Make sure that you call for this appointment the day you arrive home to insure a convenient appointment time.  10. IF YOU HAVE DISABILITY OR FAMILY LEAVE FORMS, BRING THEM TO THE OFFICE FOR PROCESSING.  DO NOT GIVE THEM TO YOUR DOCTOR.   WHEN TO CALL us [phone]: Poor pain control Reactions / problems with new medications (rash/itching, nausea, etc)  Fever over 101.5 F (38.5 C) Inability to urinate Nausea and/or vomiting Worsening swelling or bruising Continued bleeding from incision. Increased pain, redness, or drainage from the incision   The clinic  staff is available to answer your questions during regular business hours (8:30am-5pm).  Please don't hesitate to call and ask to speak to one of our nurses for clinical concerns.   If you have a medical emergency, go to the nearest emergency room or call 911.  A surgeon from [facility] is always on call at the [facility], [place] [address], [place], [place]  [address] ? MAIN: [phone] ? TOLL FREE: [phone] ?  FAX [phone] [email or web address]

## 2022-01-23 ENCOUNTER — Encounter (HOSPITAL_COMMUNITY): Payer: Self-pay | Admitting: Surgery

## 2022-01-25 LAB — SURGICAL PATHOLOGY

## 2022-11-21 IMAGING — CT CT ABD-PELV W/ CM
2 of 4 series · 17 of 46 positions shown, 19 images · IV contrast (agent unspecified)
Comparison: 02/12/2013

CLINICAL DATA: Right side abdominal pain

EXAM:
CT ABDOMEN AND PELVIS WITH CONTRAST
TECHNIQUE: Multidetector CT imaging of the abdomen and pelvis was performed
using the standard protocol following bolus administration of
intravenous contrast.

[Series 2: axial st · axial · 0.98mm/px · z∈[-467,-52]mm · 14 of 95 slices shown, 16 images]
[im 6/95  soft-tissue]
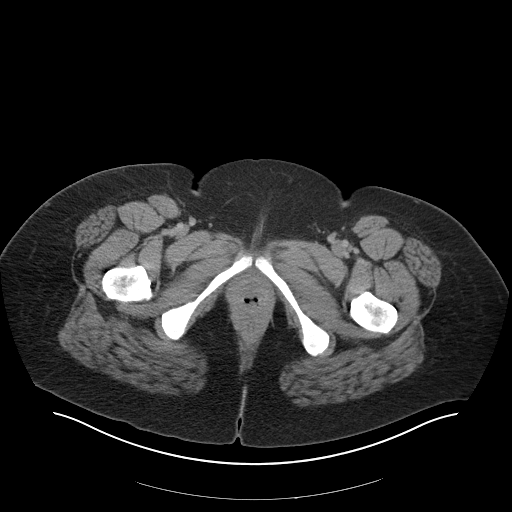
[im 6/95  bone]
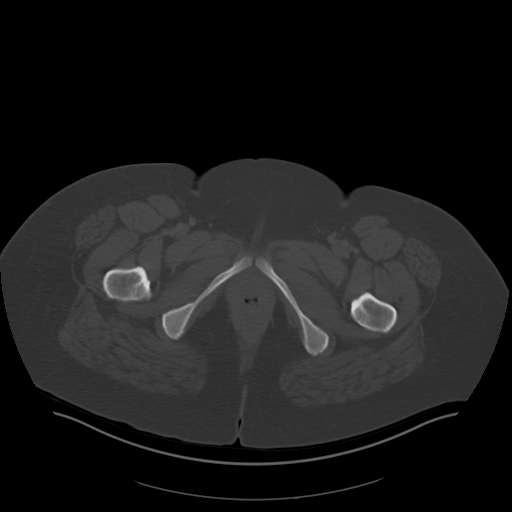
[im 12/95  soft-tissue]
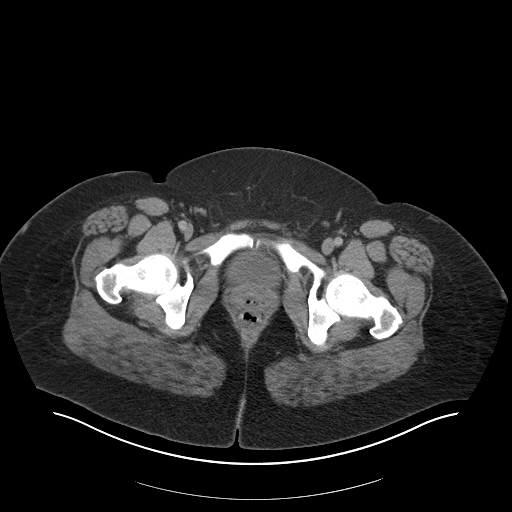
[im 17/95  soft-tissue]
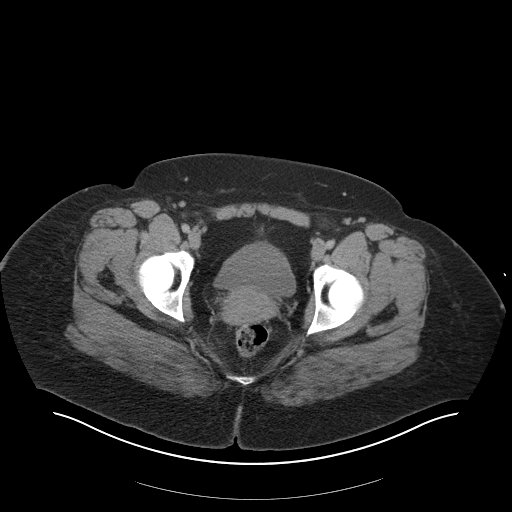
[im 28/95  soft-tissue]
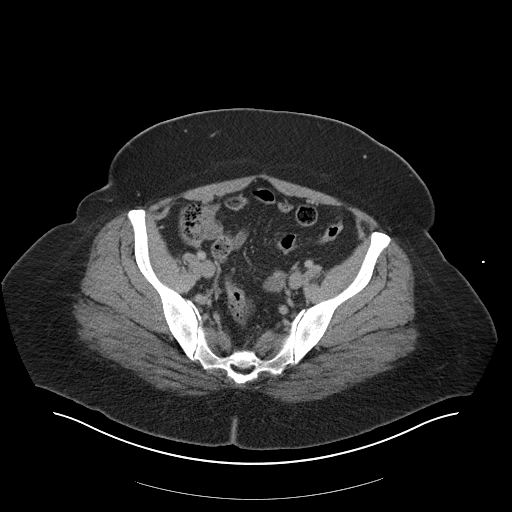
[im 34/95  soft-tissue]
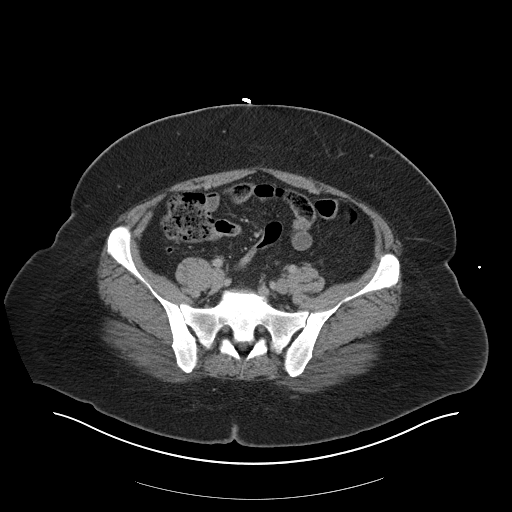
[im 39/95  soft-tissue]
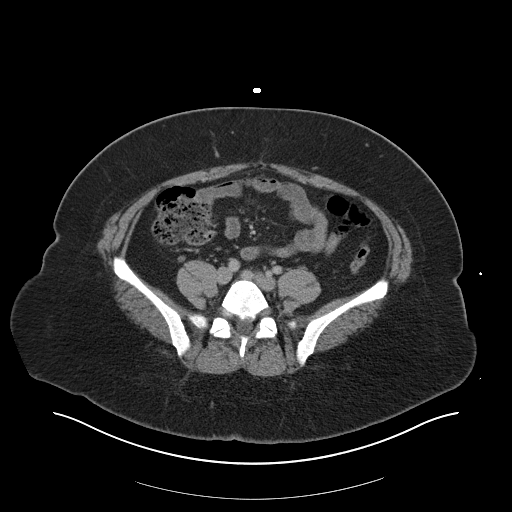
[im 45/95  soft-tissue]
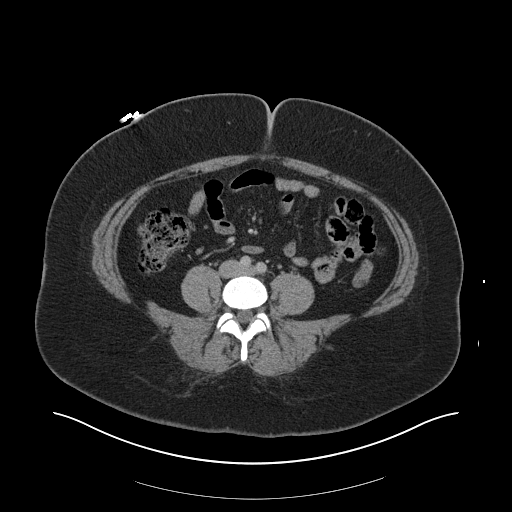
[im 50/95  soft-tissue]
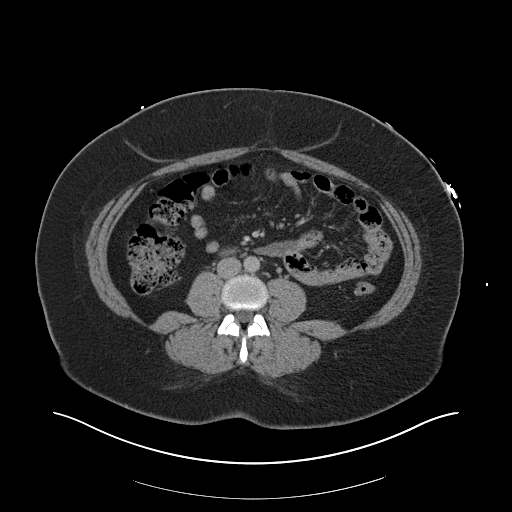
[im 56/95  soft-tissue]
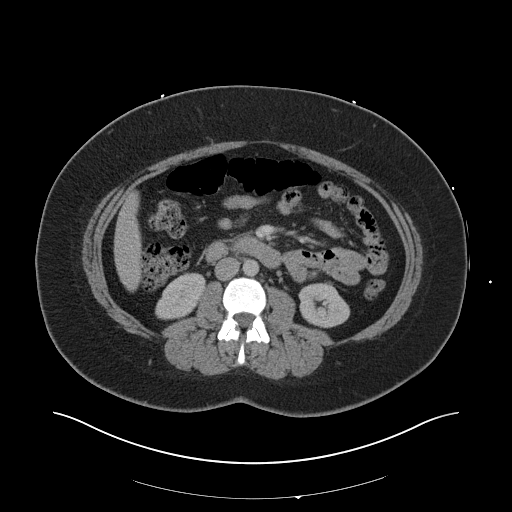
[im 56/95  bone]
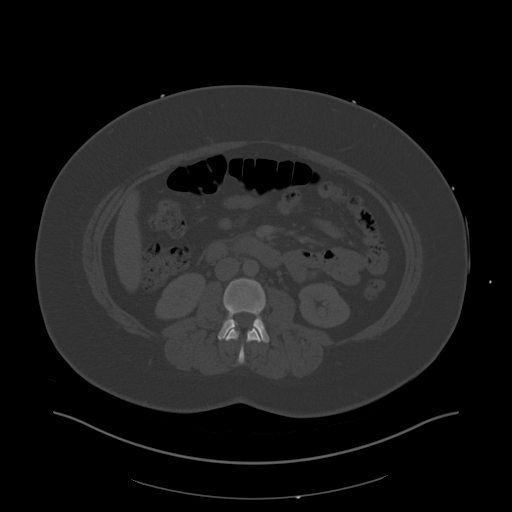
[im 61/95  soft-tissue]
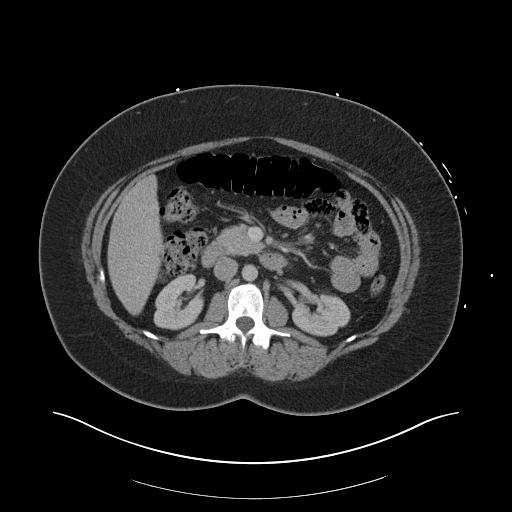
[im 72/95  soft-tissue]
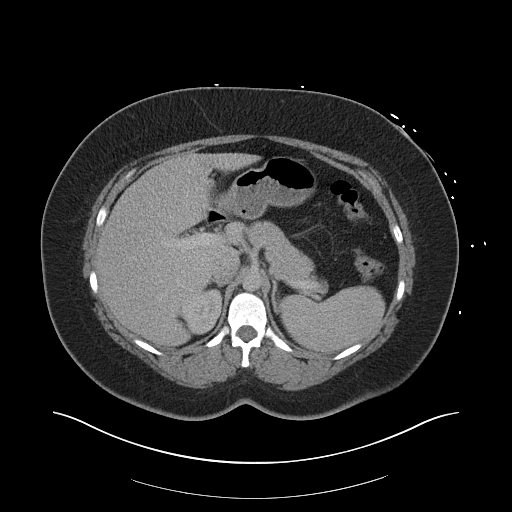
[im 78/95  soft-tissue]
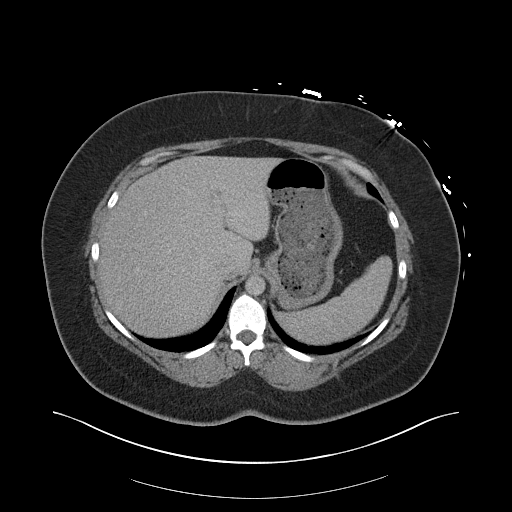
[im 83/95  soft-tissue]
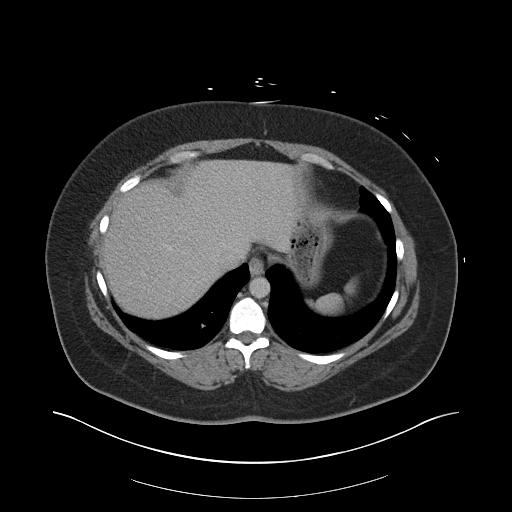
[im 89/95  soft-tissue]
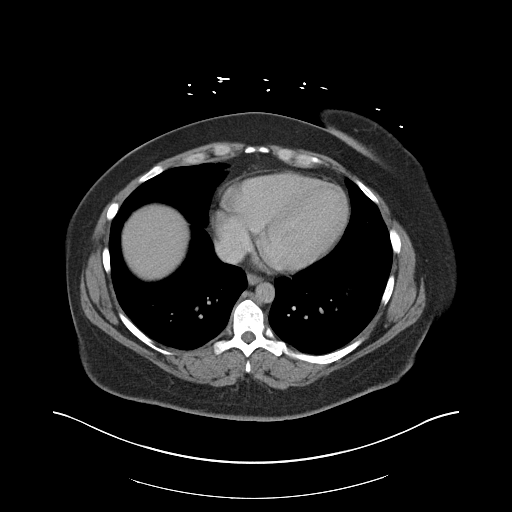

[Series 5: coronal st · coronal · 1.00mm/px · 3 of 181 slices shown]
[im 61/181  soft-tissue]
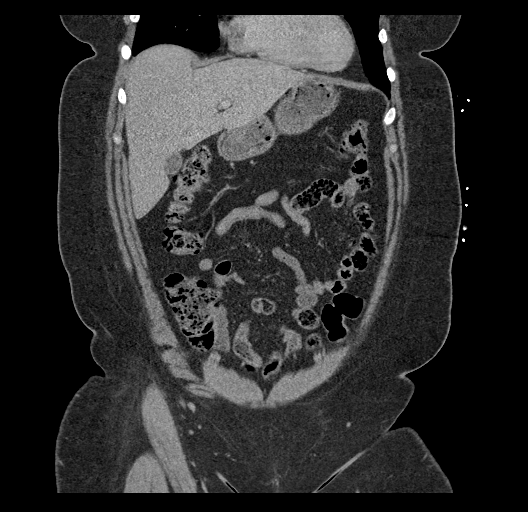
[im 81/181  soft-tissue]
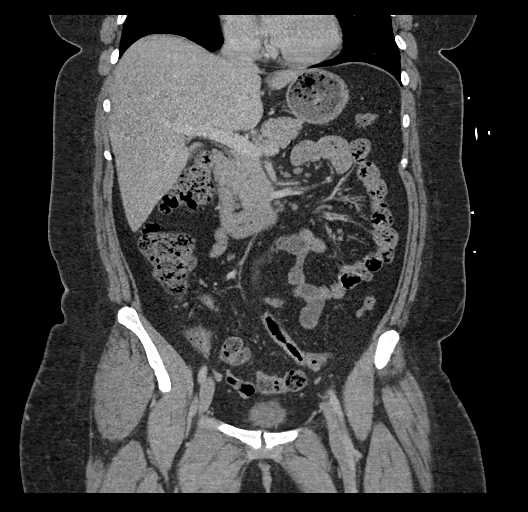
[im 101/181  soft-tissue]
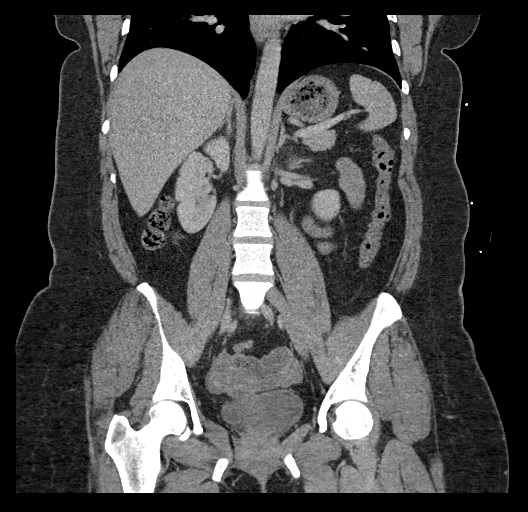

[17 of 46 positions shown; findings below may reference images not displayed]

RADIATION DOSE REDUCTION: This exam was performed according to the
departmental dose-optimization program which includes automated
exposure control, adjustment of the mA and/or kV according to
patient size and/or use of iterative reconstruction technique.

CONTRAST:  100mL OMNIPAQUE IOHEXOL 300 MG/ML  SOLN
FINDINGS: Lower chest: No acute abnormality

Hepatobiliary: No focal hepatic abnormality. Gallbladder
unremarkable.

Pancreas: No focal abnormality or ductal dilatation.

Spleen: No focal abnormality.  Normal size.

Adrenals/Urinary Tract: Punctate nonobstructing stone in the lower
pole of the left kidney. No ureteral stones or hydronephrosis.
Adrenal glands and urinary bladder unremarkable.

Stomach/Bowel: Normal appendix. Stomach, large and small bowel
grossly unremarkable.

Vascular/Lymphatic: No evidence of aneurysm or adenopathy.

Reproductive: Uterus and adnexa unremarkable. No mass. IUD in the
uterus.

Other: No free fluid or free air.

Musculoskeletal: No acute bony abnormality.
IMPRESSION: No acute findings in the abdomen or pelvis.

Punctate left lower pole nephrolithiasis.  No hydronephrosis.
